# Patient Record
Sex: Female | Born: 1970 | Race: White | Hispanic: No | Marital: Married | Smoking: Never smoker
Health system: Southern US, Academic
[De-identification: ages and names within clinical notes are randomized; demographics above are authoritative.]

## PROBLEM LIST (undated history)

## (undated) DIAGNOSIS — E063 Autoimmune thyroiditis: Secondary | ICD-10-CM

## (undated) DIAGNOSIS — G4733 Obstructive sleep apnea (adult) (pediatric): Secondary | ICD-10-CM

## (undated) DIAGNOSIS — F32A Depression, unspecified: Secondary | ICD-10-CM

## (undated) DIAGNOSIS — E039 Hypothyroidism, unspecified: Secondary | ICD-10-CM

## (undated) DIAGNOSIS — K449 Diaphragmatic hernia without obstruction or gangrene: Secondary | ICD-10-CM

## (undated) DIAGNOSIS — K222 Esophageal obstruction: Secondary | ICD-10-CM

## (undated) DIAGNOSIS — F419 Anxiety disorder, unspecified: Secondary | ICD-10-CM

## (undated) DIAGNOSIS — K589 Irritable bowel syndrome without diarrhea: Secondary | ICD-10-CM

## (undated) DIAGNOSIS — G43909 Migraine, unspecified, not intractable, without status migrainosus: Secondary | ICD-10-CM

## (undated) DIAGNOSIS — I10 Essential (primary) hypertension: Secondary | ICD-10-CM

## (undated) DIAGNOSIS — Z15060 Genetic susceptibility to colorectal cancer: Secondary | ICD-10-CM

## (undated) DIAGNOSIS — E042 Nontoxic multinodular goiter: Secondary | ICD-10-CM

## (undated) DIAGNOSIS — K219 Gastro-esophageal reflux disease without esophagitis: Secondary | ICD-10-CM

## (undated) DIAGNOSIS — J309 Allergic rhinitis, unspecified: Secondary | ICD-10-CM

## (undated) DIAGNOSIS — L853 Xerosis cutis: Secondary | ICD-10-CM

## (undated) DIAGNOSIS — B009 Herpesviral infection, unspecified: Secondary | ICD-10-CM

## (undated) DIAGNOSIS — F909 Attention-deficit hyperactivity disorder, unspecified type: Secondary | ICD-10-CM

## (undated) HISTORY — DX: Attention-deficit hyperactivity disorder, unspecified type: F90.9

## (undated) HISTORY — DX: Herpesviral infection, unspecified: B00.9

## (undated) HISTORY — DX: Xerosis cutis: L85.3

## (undated) HISTORY — DX: Allergic rhinitis, unspecified: J30.9

## (undated) HISTORY — DX: Gastro-esophageal reflux disease without esophagitis: K21.9

## (undated) HISTORY — DX: Migraine, unspecified, not intractable, without status migrainosus: G43.909

## (undated) HISTORY — DX: Essential (primary) hypertension: I10

## (undated) HISTORY — DX: Diaphragmatic hernia without obstruction or gangrene: K44.9

## (undated) HISTORY — PX: HX BREAST AUGMENTATION: SHX7

## (undated) HISTORY — PX: HX HYSTERECTOMY: SHX81

## (undated) HISTORY — DX: Obstructive sleep apnea (adult) (pediatric): G47.33

## (undated) HISTORY — DX: Depression, unspecified: F32.A

## (undated) HISTORY — DX: Autoimmune thyroiditis: E06.3

## (undated) HISTORY — DX: Hypothyroidism, unspecified: E03.9

## (undated) HISTORY — DX: Esophageal obstruction: K22.2

## (undated) HISTORY — DX: Anxiety disorder, unspecified: F41.9

## (undated) HISTORY — DX: Genetic susceptibility to colorectal cancer: Z15.060

## (undated) HISTORY — DX: Irritable bowel syndrome, unspecified: K58.9

## (undated) HISTORY — DX: Nontoxic multinodular goiter: E04.2

---

## 2004-09-10 ENCOUNTER — Ambulatory Visit: Payer: Self-pay | Admitting: Family Medicine

## 2004-11-11 ENCOUNTER — Ambulatory Visit: Payer: Self-pay | Admitting: Family Medicine

## 2004-11-23 ENCOUNTER — Ambulatory Visit: Payer: Self-pay

## 2005-01-10 ENCOUNTER — Ambulatory Visit: Payer: Self-pay | Admitting: Family Medicine

## 2005-01-31 ENCOUNTER — Ambulatory Visit: Payer: Self-pay | Admitting: Internal Medicine

## 2005-07-11 ENCOUNTER — Ambulatory Visit: Payer: Self-pay | Admitting: Family Medicine

## 2005-08-10 ENCOUNTER — Ambulatory Visit: Payer: Self-pay | Admitting: Family Medicine

## 2005-08-19 ENCOUNTER — Ambulatory Visit: Payer: Self-pay | Admitting: Family Medicine

## 2005-09-14 ENCOUNTER — Ambulatory Visit: Payer: Self-pay | Admitting: Internal Medicine

## 2005-10-17 ENCOUNTER — Ambulatory Visit: Payer: Self-pay | Admitting: Family Medicine

## 2006-06-29 ENCOUNTER — Ambulatory Visit: Payer: Self-pay | Admitting: Family Medicine

## 2006-10-28 ENCOUNTER — Encounter: Payer: Self-pay | Admitting: Internal Medicine

## 2007-08-20 ENCOUNTER — Telehealth (INDEPENDENT_AMBULATORY_CARE_PROVIDER_SITE_OTHER): Payer: Self-pay | Admitting: *Deleted

## 2008-09-26 ENCOUNTER — Telehealth: Payer: Self-pay | Admitting: Family Medicine

## 2008-11-12 ENCOUNTER — Telehealth: Payer: Self-pay | Admitting: Family Medicine

## 2008-12-01 ENCOUNTER — Telehealth: Payer: Self-pay | Admitting: Family Medicine

## 2008-12-23 ENCOUNTER — Telehealth: Payer: Self-pay | Admitting: Family Medicine

## 2009-01-02 ENCOUNTER — Ambulatory Visit: Payer: Self-pay | Admitting: Family Medicine

## 2009-01-02 DIAGNOSIS — J309 Allergic rhinitis, unspecified: Secondary | ICD-10-CM | POA: Insufficient documentation

## 2009-03-02 ENCOUNTER — Ambulatory Visit: Payer: Self-pay | Admitting: Family Medicine

## 2009-03-02 DIAGNOSIS — Z9889 Other specified postprocedural states: Secondary | ICD-10-CM | POA: Insufficient documentation

## 2009-06-16 ENCOUNTER — Ambulatory Visit: Payer: Self-pay | Admitting: Family Medicine

## 2009-06-16 DIAGNOSIS — J019 Acute sinusitis, unspecified: Secondary | ICD-10-CM | POA: Insufficient documentation

## 2009-06-16 DIAGNOSIS — R5383 Other fatigue: Secondary | ICD-10-CM

## 2009-06-16 DIAGNOSIS — R5381 Other malaise: Secondary | ICD-10-CM | POA: Insufficient documentation

## 2009-06-16 DIAGNOSIS — K130 Diseases of lips: Secondary | ICD-10-CM | POA: Insufficient documentation

## 2009-09-24 ENCOUNTER — Telehealth: Payer: Self-pay | Admitting: Family Medicine

## 2009-10-08 ENCOUNTER — Telehealth: Payer: Self-pay | Admitting: Family Medicine

## 2009-10-22 ENCOUNTER — Encounter: Payer: Self-pay | Admitting: Family Medicine

## 2009-10-23 ENCOUNTER — Telehealth: Payer: Self-pay | Admitting: Family Medicine

## 2009-11-23 ENCOUNTER — Telehealth: Payer: Self-pay | Admitting: Family Medicine

## 2009-11-24 ENCOUNTER — Telehealth: Payer: Self-pay | Admitting: Family Medicine

## 2010-01-11 ENCOUNTER — Telehealth: Payer: Self-pay | Admitting: Family Medicine

## 2010-08-18 ENCOUNTER — Other Ambulatory Visit (INDEPENDENT_AMBULATORY_CARE_PROVIDER_SITE_OTHER): Payer: BC Managed Care – PPO

## 2010-08-18 DIAGNOSIS — E042 Nontoxic multinodular goiter: Secondary | ICD-10-CM

## 2010-10-10 LAB — CONVERTED CEMR LAB
CO2: 23 meq/L (ref 19–32)
Calcium: 9.6 mg/dL (ref 8.4–10.5)
Eosinophils Absolute: 0.2 10*3/uL (ref 0.0–0.7)
Eosinophils Relative: 4 % (ref 0–5)
HCT: 37.7 % (ref 36.0–46.0)
Lymphs Abs: 1.7 10*3/uL (ref 0.7–4.0)
MCV: 88.9 fL (ref 78.0–100.0)
Monocytes Relative: 8 % (ref 3–12)
Neutrophils Relative %: 52 % (ref 43–77)
RBC: 4.24 M/uL (ref 3.87–5.11)
Sodium: 142 meq/L (ref 135–145)
WBC: 5 10*3/uL (ref 4.0–10.5)
aPTT: 34 s (ref 24–37)

## 2010-10-12 NOTE — Progress Notes (Signed)
  Phone Note From Other Clinic   Summary of Call: call from oral sugeon Dr Cherly Anderson  he is seeing pt for persistant L jaw pain teeth are fine and no signs of TMJ is trying abx and interested in ordering a CT -- but primary care has to do that needs CT maxofacial with and without contrast to eval L mandible body for idopathic jaw pain  Follow-up for Phone Call        please call pt and see if she wants me to go ahead and order the test- thanks  Follow-up by: Judith Part MD,  October 23, 2009 1:43 PM  Additional Follow-up for Phone Call Additional follow up Details #1::        Left message on voicemail  to return call. Instructed pt. to leave her response on the VM if nurse not available.  Lugene Fuquay CMA (AAMA)  October 23, 2009 2:21 PM   Left message for patient to call back. Lewanda Rife LPN  October 26, 2009 10:56 AM     Additional Follow-up for Phone Call Additional follow up Details #2::    Patient will call next week to let us know if antibiotics work if not she will so the ct  ok- thanks for the update Follow-up by: Benny Lennert CMA Duncan Dull),  October 28, 2009 11:03 AM

## 2010-10-12 NOTE — Progress Notes (Signed)
Summary: call a nurse   Phone Note Call from Patient   Summary of Call: Triage Record Num: [id number] Operator: Albertine Grates Patient Name: Jocelyn Watson Call Date & Time: 01/10/2010 5:05:20PM Patient Phone: [phone] PCP: Audrie Gallus. Tower Patient Gender: Female PCP Fax : Patient DOB: June 01, 1971 Practice Name: [facility] Reason for Call: Thinks has UTI since 5-1. Has burning since 5-1. Has frequency. Afebrile. Is going out of town 5-2. Advised needs eval but wants MD notified as is going out of country. Dr. Clent Ridges notified and advised Macrobid 100mg  BID x 7 days and called to [phone]. Protocol(s) Used: Urinary Symptoms - Female Recommended Outcome per Protocol: See Provider within 24 hours Reason for Outcome: Urinary tract symptoms Care Advice:  ~ 05/ Initial call taken by: Melody Comas,  Jan 11, 2010 8:49 AM

## 2010-10-12 NOTE — Progress Notes (Signed)
Summary: RX Flonase  Phone Note Call from Patient   Caller: Patient Call For: Jocelyn Watson Summary of Call: Patient has a new insurance company and needs written rx for [facility] for a 90 day supply of her Flonase nasal spray. Patient also wants to know if you will give her a rx for prenatal vitamins to take. Please let patient know when rx's are ready for pickup, so that she can mail them off.  Follow-up for Phone Call        printed in put in nurse in box for pickup  Follow-up by: Jocelyn Watson,  September 25, 2009 7:59 AM  Additional Follow-up for Phone Call Additional follow up Details #1::        [facility] advising pt ok to pick up. Additional Follow-up by: Lowella Petties CMA,  September 25, 2009 8:17 AM    New/Updated Medications: PRENATAL VITAMINS 0.8 MG TABS (PRENATAL MULTIVIT-MIN-FE-FA) 1 by mouth once daily Prescriptions: PRENATAL VITAMINS 0.8 MG TABS (PRENATAL MULTIVIT-MIN-FE-FA) 1 by mouth once daily  #90 x 3   Entered and Authorized by:   Jocelyn Watson   Signed by:   Jocelyn Watson on 09/25/2009   Method used:   Print then Give to Patient   RxID:   [id number] FLONASE 50 MCG/ACT SUSP (FLUTICASONE PROPIONATE) 2 sprays in each nostril once daily  #3 mdi x 3   Entered and Authorized by:   Jocelyn Watson   Signed by:   Jocelyn Watson on 09/25/2009   Method used:   Print then Give to Patient   RxID:   [id number]

## 2010-10-12 NOTE — Progress Notes (Signed)
Summary: Clarification on prenatal vitamins  Phone Note Refill Request Call back at [phone] option [phone] Message from:  [facility] on October 08, 2009 4:50 PM  Refills Requested: Medication #1:  PRENATAL VITAMINS 0.8 MG TABS 1 by mouth once daily. Received call from [facility] needing clarification on Prenatal vitamins.  They have the Prenatal Plus vitamins but needs the ok from Dr. Milinda Antis before they fill the Rx.  Refer to order# [id number]   Method Requested: Telephone to Pharmacy Initial call taken by: Linde Gillis CMA Duncan Dull),  October 08, 2009 4:51 PM  Follow-up for Phone Call        please give them the ok to fill that- thanks Follow-up by: Judith Part MD,  October 08, 2009 8:44 PM  Additional Follow-up for Phone Call Additional follow up Details #1::        Advised pharmacist. Additional Follow-up by: Lowella Petties CMA,  October 09, 2009 9:56 AM

## 2010-10-12 NOTE — Progress Notes (Signed)
Summary: asking for xanax  Phone Note Call from Patient Call back at [phone]   Caller: Patient Call For: Judith Part MD Summary of Call: Pt had called yesterday asking for something to help her sleep, Remus Loffler was called in but she says that's not what she needs.  She says this is more of a stress issue than sleep issue.  She is asking that xanax be called to [facility].  She said she had this about 7 years ago when she went through a divorce. Initial call taken by: Lowella Petties CMA,  November 24, 2009 10:05 AM  Follow-up for Phone Call        that is fine - just use great caution of sedation and also habit forming potential  do not use on a regular basis  px written on EMR for call in  Follow-up by: Judith Part MD,  November 24, 2009 10:33 AM  Additional Follow-up for Phone Call Additional follow up Details #1::        Left message for patient to call back. Medication [facility]oned [facility] as instructed. Lewanda Rife LPN  November 24, 2009 11:31 AM   Advised pt. Additional Follow-up by: Lowella Petties CMA,  November 24, 2009 11:36 AM    New/Updated Medications: ALPRAZOLAM 0.5 MG TABS (ALPRAZOLAM) 1 by mouth once daily as needed anxiety Prescriptions: ALPRAZOLAM 0.5 MG TABS (ALPRAZOLAM) 1 by mouth once daily as needed anxiety  #20 x 0   Entered and Authorized by:   Judith Part MD   Signed by:   Lewanda Rife LPN on 33/29/5188   Method used:   Telephoned to ...       [facility] (retail)       [address]       [place], [place]  [address]       Ph: [phone]       Fax: [phone]   RxID:   [id number]

## 2010-10-12 NOTE — Letter (Signed)
Summary: [facility]   Imported By: Lanelle Bal 11/10/2009 09:27:23  _____________________________________________________________________  External Attachment:    Type:   Image     Comment:   External Document

## 2010-10-12 NOTE — Progress Notes (Signed)
Summary: ? Rx for Xanax  Phone Note Call from Patient Call back at Home Phone [phone]   Caller: Patient Call For: Jocelyn Watson Summary of Call: Patient would like a Rx called in for Xanax.  She is getting married next month and is having trouble sleeping.  [facility] Initial call taken by: Linde Gillis CMA Duncan Dull),  November 23, 2009 3:10 PM  Follow-up for Phone Call        congratulations on wedding  if this is purely a sleep issue - would rather use something less habit forming like Remus Loffler will put that on EMR for call in - use with caution as needed  try not to use every night  Follow-up by: Jocelyn Watson,  November 23, 2009 5:14 PM  Additional Follow-up for Phone Call Additional follow up Details #1::        Patient notified as instructed by telephone v/m due to lateness of day. [facility] as instructed. Lewanda Rife LPN  November 23, 2009 5:35 PM     New/Updated Medications: AMBIEN 10 MG TABS (ZOLPIDEM TARTRATE) 1/2 to 1 by mouth at bedtime as needed insomnia Prescriptions: AMBIEN 10 MG TABS (ZOLPIDEM TARTRATE) 1/2 to 1 by mouth at bedtime as needed insomnia  #30 x 0   Entered and Authorized by:   Jocelyn Watson   Signed by:   Lewanda Rife LPN on 09/81/1914   Method used:   [facility] ...       [facility]  [id number]       [address]       [place], [place]  [address]       Ph: [phone]       Fax: [phone]   RxID:   [id number]

## 2011-08-12 ENCOUNTER — Encounter (INDEPENDENT_AMBULATORY_CARE_PROVIDER_SITE_OTHER): Payer: Self-pay

## 2011-08-22 ENCOUNTER — Encounter (INDEPENDENT_AMBULATORY_CARE_PROVIDER_SITE_OTHER): Payer: Self-pay

## 2011-08-22 ENCOUNTER — Ambulatory Visit (INDEPENDENT_AMBULATORY_CARE_PROVIDER_SITE_OTHER): Payer: BC Managed Care – PPO

## 2011-08-22 VITALS — BP 110/84 | HR 72 | Resp 20 | Ht 63.0 in | Wt 152.8 lb

## 2011-08-22 DIAGNOSIS — E049 Nontoxic goiter, unspecified: Secondary | ICD-10-CM

## 2011-08-23 DIAGNOSIS — E049 Nontoxic goiter, unspecified: Secondary | ICD-10-CM | POA: Insufficient documentation

## 2011-08-23 NOTE — Progress Notes (Signed)
,  ULTRASOUND:  The patient underwent an ultrasound with the 40 mm 8 megahertz probe.  The following views were obtained; left short axis, right short axis, left long axis, and right long axis.      Right lobe is 0.95 cm x 1.25 cm x 1.30 cm V=0.81 cm3 Nodule is  0.50 cm x0.66 cm x 0.63 cm V= 0.12 cm3 In the longitudinal plane the nodule is 0.81 cm x 0.87 cm x 0.89 cm V=0.33cm3  Isthmus  0.26cm    Left Lobe is 1.07cm x 1.25 cm x 1.15 cm V=0.79cm3          Diagnosis: Non toxic Uni nodular goiter. No significant change since the previous study.      Recommendation: TSH, Repeat scan in one year.

## 2011-08-29 ENCOUNTER — Telehealth: Payer: Self-pay | Admitting: *Deleted

## 2011-08-29 NOTE — Telephone Encounter (Signed)
Pt states LMOM on Wednesday 12.12.12 concerning getting medical records for Insurance purposes [[facility]/fax: [phone]]

## 2011-08-29 NOTE — Telephone Encounter (Signed)
Spoke with pt and she is applying for insurance and wants last 5 years of notes for insurance purposes. Pt also request name change. I transferred pt to [facility].

## 2012-08-21 ENCOUNTER — Encounter (INDEPENDENT_AMBULATORY_CARE_PROVIDER_SITE_OTHER): Payer: Self-pay

## 2012-08-21 ENCOUNTER — Ambulatory Visit (INDEPENDENT_AMBULATORY_CARE_PROVIDER_SITE_OTHER): Payer: BC Managed Care – PPO

## 2012-08-21 VITALS — BP 128/92 | HR 94 | Resp 20 | Ht 63.0 in | Wt 159.0 lb

## 2012-08-21 DIAGNOSIS — E049 Nontoxic goiter, unspecified: Secondary | ICD-10-CM

## 2012-08-21 LAB — THYROXINE, FREE (FREE T4): THYROXINE, FREE (FREE T4): 0.9 ng/dL (ref 0.9–1.8)

## 2012-08-22 LAB — THYROID STIMULATING HORMONE (SENSITIVE TSH): TSH: 1.38 u[IU]/mL (ref 0.350–5.550)

## 2012-08-23 LAB — THYROPEROXIDASE (TPO) ANTIBODIES, SERUM: THYROID PEROXIDASE AB: 98 IU/mL — ABNORMAL HIGH (ref 0.0–35.0)

## 2012-08-23 NOTE — Progress Notes (Signed)
[facility], Department of Medicine and Specialty Office  [address] [place]  [place], [place] [address]  [phone]    PROGRESS NOTE    PATIENT NAME: Ana Perez, Ana Perez  CHART NUMBER: [id number]  DATE OF BIRTH: [birth year]  DATE OF SERVICE: 08/21/2012    SUBJECTIVE:  This is a 41 year old female with a history of a nontoxic nodular goiter.  She is having serial ultrasounds.      Ultrasound was performed with the linear ray probe.  Transverse cuts were obtained high, low and middle of the thyroid.  Longitudinal cuts were turned lateral, medial and middle.  Right lobe measures 1.02 x 0.908.  The isthmus is 0.019.  Left lobe is 1.23 x 1.36.  The tissue on both sides appears somewhat hypoechoic with a spongiform appearance.  This has not changed significantly since the previous study.    TSH is 1.380.  TPO antibodies are positive at 98.  FT4 is 0.9.    IMPRESSION:  Autoimmune thyroiditis.  The patient is euthyroid at this point.      PLAN:  She is to have another scan next year.      Katherene Ponto, MD  Professor, Department of Internal Medicine  [facility] [place] [facility]    HY/QM/[id number]; D: 08/23/2012 13:43:02; T: 08/23/2012 14:47:03

## 2013-08-21 ENCOUNTER — Ambulatory Visit (INDEPENDENT_AMBULATORY_CARE_PROVIDER_SITE_OTHER): Payer: BC Managed Care – PPO

## 2013-08-21 VITALS — BP 128/88 | HR 89 | Resp 20 | Ht 63.0 in | Wt 177.8 lb

## 2013-08-21 DIAGNOSIS — E063 Autoimmune thyroiditis: Secondary | ICD-10-CM

## 2013-08-21 LAB — THYROID STIMULATING HORMONE (SENSITIVE TSH): TSH: 1.52 u[IU]/mL (ref 0.350–5.550)

## 2013-08-21 LAB — THYROXINE, FREE (FREE T4): THYROXINE, FREE (FREE T4): 0.8 ng/dL — ABNORMAL LOW (ref 0.9–1.8)

## 2013-08-23 ENCOUNTER — Telehealth (INDEPENDENT_AMBULATORY_CARE_PROVIDER_SITE_OTHER): Payer: Self-pay

## 2013-08-23 DIAGNOSIS — E063 Autoimmune thyroiditis: Secondary | ICD-10-CM

## 2013-08-23 DIAGNOSIS — E049 Nontoxic goiter, unspecified: Secondary | ICD-10-CM

## 2013-08-23 MED ORDER — LEVOTHYROXINE 75 MCG TABLET
75.0000 ug | ORAL_TABLET | Freq: Every day | ORAL | Status: DC
Start: 2013-08-23 — End: 2013-12-31

## 2013-08-23 NOTE — Telephone Encounter (Signed)
Start thyroid hormone. Get lab in 6 weeks.

## 2013-08-23 NOTE — Telephone Encounter (Signed)
 Left message on patients cell phone [phone] that medication had been sent to her pharmacy and she needs to repeat her blood work in 6 weeks.

## 2013-08-23 NOTE — Telephone Encounter (Signed)
Patient calling back at your request to find out her lab results and if she needs to be on medication or not.

## 2013-08-26 NOTE — Progress Notes (Unsigned)
[facility], Department of Medicine and Specialty Office  [address] [place]  [place], [place] [address]  [phone]    PROGRESS NOTE    PATIENT NAME: Ana Perez, Ana Perez  CHART NUMBER: [id number]  DATE OF BIRTH: 17-Mar-1971  DATE OF SERVICE: 08/21/2013    SUBJECTIVE:  Ultrasound was performed using linear array probe.  Images were obtained in the upper, mid and lower portions transversally.  The right lobe measures 1.34 x 1.15 x 5.68 for a volume of 3.48 cm3.  Left lobe is 0.961 x 1.22 x 4.17 for a volume of 2.55 cm3.  The isthmus is 0.238 cm.  The scan shows typical scarring, Hashimoto's.  No specific nodules are noted.  No change has occurred since the previous study.    TSH was 1.520.  FT4 is 0.8.    PLAN:  In view of the fact the patient has positive TPO antibodies levothyroxine has been increased.  She is to repeat TSH in 6 weeks.  Images stored in [place].      Katherene Ponto, MD  Professor, Department of Internal Medicine  [facility] [place] [facility]    HY/QM/[id number]; D: 08/26/2013 13:04:50; T: 08/26/2013 16:19:23

## 2013-12-27 LAB — THYROXINE, FREE (FREE T4): THYROXINE, FREE (FREE T4): 0.5 ng/dL — ABNORMAL LOW (ref 0.9–1.8)

## 2013-12-30 ENCOUNTER — Telehealth (INDEPENDENT_AMBULATORY_CARE_PROVIDER_SITE_OTHER): Payer: Self-pay

## 2013-12-30 DIAGNOSIS — E063 Autoimmune thyroiditis: Secondary | ICD-10-CM

## 2013-12-30 NOTE — Telephone Encounter (Signed)
Patient is calling for her lab work results.

## 2013-12-31 MED ORDER — LEVOTHYROXINE 100 MCG TABLET
100.0000 ug | ORAL_TABLET | Freq: Every day | ORAL | Status: DC
Start: 2013-12-31 — End: 2024-02-14

## 2013-12-31 NOTE — Telephone Encounter (Signed)
THYROID HAS BEEN INCREASED. needs LABS IN  6 WEEKS

## 2013-12-31 NOTE — Telephone Encounter (Signed)
Left msg for pt to return my call

## 2014-02-14 LAB — THYROXINE, FREE (FREE T4): THYROXINE, FREE (FREE T4): 1.3 ng/dL (ref 0.9–1.8)

## 2014-02-14 LAB — THYROID STIMULATING HORMONE (SENSITIVE TSH): TSH: 0.354 u[IU]/mL (ref 0.350–5.550)

## 2014-08-21 ENCOUNTER — Encounter (INDEPENDENT_AMBULATORY_CARE_PROVIDER_SITE_OTHER): Payer: BC Managed Care – PPO

## 2015-10-23 ENCOUNTER — Encounter: Payer: Self-pay | Admitting: Primary Care

## 2015-10-23 ENCOUNTER — Telehealth: Payer: Self-pay | Admitting: Primary Care

## 2015-10-23 ENCOUNTER — Ambulatory Visit (INDEPENDENT_AMBULATORY_CARE_PROVIDER_SITE_OTHER): Payer: BLUE CROSS/BLUE SHIELD | Admitting: Primary Care

## 2015-10-23 ENCOUNTER — Encounter: Payer: Self-pay | Admitting: Radiology

## 2015-10-23 VITALS — BP 124/82 | HR 74 | Temp 97.7°F | Ht 69.0 in | Wt 162.8 lb

## 2015-10-23 DIAGNOSIS — F909 Attention-deficit hyperactivity disorder, unspecified type: Secondary | ICD-10-CM | POA: Insufficient documentation

## 2015-10-23 MED ORDER — AMPHETAMINE-DEXTROAMPHETAMINE 20 MG PO TABS: 20.0000 mg | ORAL_TABLET | Freq: Two times a day (BID) | ORAL | Status: DC

## 2015-10-23 NOTE — Progress Notes (Signed)
Pre visit review using our clinic review tool, if applicable. No additional management support is needed unless otherwise documented below in the visit note. 

## 2015-10-23 NOTE — Progress Notes (Signed)
   Subjective:    Patient ID: Jocelyn Watson, female    DOB: 12-Jun-1971, 45 y.o.   MRN: [id number]  HPI  Jocelyn Watson is a 45 year old female who presents today to establish care and discuss the problems mentioned below. Will obtain old records. Her last physical was in Spring of 2016.   1) ADHD: Endorses she was diagnosed in 2010 in [place] by previous PCP. She is currently managed on Adderall 20 mg BID and has been on the medication since 2010. She is currently out of her medication and is requesting a refill.  Her symptoms include difficulty completing tasks and concentrating. Denies chest pain, palpitations.   Review of Systems  Constitutional: Negative for unexpected weight change.  HENT: Negative for rhinorrhea.   Respiratory: Negative for cough and shortness of breath.   Cardiovascular: Negative for chest pain.  Gastrointestinal: Negative for diarrhea and constipation.  Genitourinary:       Regular periods  Musculoskeletal: Negative for myalgias and arthralgias.  Skin: Negative for rash.  Neurological: Negative for dizziness, numbness and headaches.  Psychiatric/Behavioral:       Denies concerns for anxiety or depression       Past Medical History  Diagnosis Date  . ADHD (attention deficit hyperactivity disorder)   . Allergic rhinitis     Social History   Social History  . Marital Status: Married    Spouse Name: N/A  . Number of Children: N/A  . Years of Education: N/A   Occupational History  . Not on file.   Social History Main Topics  . Smoking status: Never Smoker   . Smokeless tobacco: Not on file  . Alcohol Use: No  . Drug Use: No  . Sexual Activity: Not on file   Other Topics Concern  . Not on file   Social History Narrative   Married.   No Children.   Works in [employer].   Enjoys entertaining, working out.     No past surgical history on file.  Family History  Problem Relation Age of Onset  . Hypothyroidism Mother   . Heart disease  Father     Allergies  Allergen Reactions  . Codeine     REACTION: fever, vomiting    No current outpatient prescriptions on file prior to visit.   No current facility-administered medications on file prior to visit.    BP 124/82 mmHg  Pulse 74  Temp(Src) 97.7 F (36.5 C) (Oral)  Ht  (1.753 m)  Wt 162 lb 12.8 oz (73.846 kg)  BMI 24.03 kg/m2  SpO2 97%  LMP 10/10/2015    Objective:   Physical Exam  Constitutional: She is oriented to person, place, and time. She appears well-nourished.  Cardiovascular: Normal rate and regular rhythm.   Pulmonary/Chest: Effort normal and breath sounds normal.  Neurological: She is alert and oriented to person, place, and time.  Skin: Skin is warm and dry.  Psychiatric: She has a normal mood and affect.          Assessment & Plan:

## 2015-10-23 NOTE — Assessment & Plan Note (Signed)
Endorses history of since 2010, diagnosed by prior PCP in [place] and is out of medication. She is upset after I told her that I would need to gather records before I could prescribe any additional refills other than the one provided today.  We are working to get her records, only received 2 office visits from July and October 2017. She endorses care since 2010 from various locations of prior PCP office.  Will obtain records prior to any additional refills. UDS and controlled substance contract obtained.

## 2015-10-23 NOTE — Telephone Encounter (Signed)
Please notify Ms. Posas that I received records from 2015 and 2016 which is enough.   There is no evidence of formal ADHD testing in her records, but because she's been managed on this medication for years, I will continue to refill. If she notices that her medication isn't managing her symptoms as well, and requires an increase in her dose, she will need to go for formal testing.   As for now, I've printed off 2 additional scripts for her to use in March and April. Please have her call us when she's running low. These are ready for pick up at her convenience.   I would like to see her for follow up in 6 months, will you please schedule?  Thanks.

## 2015-10-23 NOTE — Patient Instructions (Signed)
We will work on gathering your records from the various locations of your prior PCP office. Please give me some time to complete this.  Stop by the lab for your urine drug screen and controlled substance contract.  It was a pleasure to meet you today! Please don't hesitate to call me with any questions. Welcome to [facility]!

## 2015-10-26 NOTE — Telephone Encounter (Signed)
Message left for patient to return my call.  

## 2015-10-26 NOTE — Telephone Encounter (Signed)
Called and notified patient of Kate's comments. Patient verbalized understanding. Rx are left in front office.

## 2015-11-11 ENCOUNTER — Ambulatory Visit: Payer: BLUE CROSS/BLUE SHIELD | Admitting: Primary Care

## 2015-11-12 ENCOUNTER — Encounter: Payer: Self-pay | Admitting: Primary Care

## 2015-11-13 ENCOUNTER — Ambulatory Visit: Payer: BLUE CROSS/BLUE SHIELD | Admitting: Primary Care

## 2016-01-05 ENCOUNTER — Other Ambulatory Visit: Payer: Self-pay | Admitting: Primary Care

## 2016-01-05 ENCOUNTER — Encounter: Payer: Self-pay | Admitting: Primary Care

## 2016-01-05 DIAGNOSIS — F909 Attention-deficit hyperactivity disorder, unspecified type: Secondary | ICD-10-CM

## 2016-01-05 MED ORDER — AMPHETAMINE-DEXTROAMPHETAMINE 20 MG PO TABS: 20.0000 mg | ORAL_TABLET | Freq: Two times a day (BID) | ORAL | Status: DC

## 2016-01-05 MED ORDER — AMPHETAMINE-DEXTROAMPHETAMINE 20 MG PO TABS
20.0000 mg | ORAL_TABLET | Freq: Every day | ORAL | Status: DC
Start: 2016-01-05 — End: 2016-04-14

## 2016-04-14 ENCOUNTER — Other Ambulatory Visit: Payer: Self-pay | Admitting: Primary Care

## 2016-04-14 DIAGNOSIS — F909 Attention-deficit hyperactivity disorder, unspecified type: Secondary | ICD-10-CM

## 2016-04-15 ENCOUNTER — Other Ambulatory Visit: Payer: Self-pay | Admitting: Primary Care

## 2016-04-15 DIAGNOSIS — F909 Attention-deficit hyperactivity disorder, unspecified type: Secondary | ICD-10-CM

## 2016-04-15 MED ORDER — AMPHETAMINE-DEXTROAMPHETAMINE 20 MG PO TABS: 20.0000 mg | ORAL_TABLET | Freq: Two times a day (BID) | ORAL | 0 refills | Status: DC

## 2016-07-11 ENCOUNTER — Other Ambulatory Visit: Payer: Self-pay | Admitting: Primary Care

## 2016-07-11 DIAGNOSIS — F909 Attention-deficit hyperactivity disorder, unspecified type: Secondary | ICD-10-CM

## 2016-07-11 NOTE — Telephone Encounter (Signed)
Last refill 04/15/16 #60 and 3 scripts given Last office visit 10/23/15

## 2016-07-12 ENCOUNTER — Other Ambulatory Visit: Payer: Self-pay | Admitting: Primary Care

## 2016-07-12 DIAGNOSIS — F909 Attention-deficit hyperactivity disorder, unspecified type: Secondary | ICD-10-CM

## 2016-07-12 MED ORDER — AMPHETAMINE-DEXTROAMPHETAMINE 20 MG PO TABS: 20.0000 mg | ORAL_TABLET | Freq: Two times a day (BID) | ORAL | 0 refills | Status: DC

## 2016-07-12 NOTE — Telephone Encounter (Signed)
Called patient and notified her that prescriptions are ready for pick up.  Also notified patient that she will need a follow up visit early February for any further refills.

## 2016-09-14 ENCOUNTER — Encounter: Payer: Self-pay | Admitting: Primary Care

## 2016-09-14 ENCOUNTER — Ambulatory Visit (INDEPENDENT_AMBULATORY_CARE_PROVIDER_SITE_OTHER): Payer: BLUE CROSS/BLUE SHIELD | Admitting: Primary Care

## 2016-09-14 ENCOUNTER — Other Ambulatory Visit: Payer: Self-pay | Admitting: Primary Care

## 2016-09-14 VITALS — BP 116/78 | HR 66 | Temp 97.4°F | Ht 69.0 in | Wt 165.0 lb

## 2016-09-14 DIAGNOSIS — Z79899 Other long term (current) drug therapy: Secondary | ICD-10-CM | POA: Diagnosis not present

## 2016-09-14 DIAGNOSIS — F909 Attention-deficit hyperactivity disorder, unspecified type: Secondary | ICD-10-CM | POA: Diagnosis not present

## 2016-09-14 DIAGNOSIS — H6122 Impacted cerumen, left ear: Secondary | ICD-10-CM | POA: Diagnosis not present

## 2016-09-14 MED ORDER — AMPHETAMINE-DEXTROAMPHETAMINE 20 MG PO TABS: 20.0000 mg | ORAL_TABLET | Freq: Two times a day (BID) | ORAL | 0 refills | Status: DC

## 2016-09-14 NOTE — Progress Notes (Signed)
Pre visit review using our clinic review tool, if applicable. No additional management support is needed unless otherwise documented below in the visit note. 

## 2016-09-14 NOTE — Patient Instructions (Addendum)
Please notify me if you find the prescription that was printed on 07/12/16 with a do not fill date until 09/10/16.  Your ECG looks good.  Stop by the lab to update your urine drug screen and controlled substance contract.  Follow up in 1 year for re-evaluation. It was a pleasure to see you today!

## 2016-09-14 NOTE — Progress Notes (Signed)
Subjective:    Patient ID: Jocelyn IvoryLisa Marie Watson, female    DOB: 10/12/1970, 46 y.o.   MRN: [id number]  HPI  Ms. Jocelyn Watson is a 46 year old female who presents today for follow up of ADHD. Diagnosed in 2010 by prior PCP. She established with us as a new patient nearly 1 year ago as she moved from [place]. She is currently managed on Adderall 20 mg BID and has been on this dose for years.   She denies chest pain, palpitations, dizziness, insomnia. She was prescribed three months supply of her Adderall on October 31st. Her last Rx is to be filled on 128/30/17. She states she only received two prescriptions at that time and is nearly out. She will check at home for her prescription and also contact the pharmacy.  2) Ear Pain: Located to her left ear that she first noticed several months ago. She's mostly noticed a decreased ability to hear. She has a history of cerumen impaction and has had to have cerumen removed with instrumentation as she cannot tolerate irrigation. She denies fevers, cough, sore throat, right ear symptoms.  Review of Systems  Constitutional: Negative for fever.  HENT: Positive for ear pain. Negative for congestion, sinus pressure and sore throat.   Respiratory: Negative for cough.   Cardiovascular: Negative for chest pain and palpitations.  Neurological: Negative for dizziness.       Past Medical History:  Diagnosis Date  . ADHD (attention deficit hyperactivity disorder)   . Allergic rhinitis      Social History   Social History  . Marital status: Married    Spouse name: N/A  . Number of children: N/A  . Years of education: N/A   Occupational History  . Not on file.   Social History Main Topics  . Smoking status: Never Smoker  . Smokeless tobacco: Not on file  . Alcohol use No  . Drug use: No  . Sexual activity: Not on file   Other Topics Concern  . Not on file   Social History Narrative   Married.   No Children.   Works in [employer].   Enjoys  entertaining, working out.     No past surgical history on file.  Family History  Problem Relation Age of Onset  . Hypothyroidism Mother   . Heart disease Father     Allergies  Allergen Reactions  . Codeine     REACTION: fever, vomiting    Current Outpatient Prescriptions on File Prior to Visit  Medication Sig Dispense Refill  . amphetamine-dextroamphetamine (ADDERALL) 20 MG tablet Take 1 tablet (20 mg total) by mouth 2 (two) times daily. 60 tablet 0  . amphetamine-dextroamphetamine (ADDERALL) 20 MG tablet Take 1 tablet (20 mg total) by mouth 2 (two) times daily. 60 tablet 0  . amphetamine-dextroamphetamine (ADDERALL) 20 MG tablet Take 1 tablet (20 mg total) by mouth 2 (two) times daily. 60 tablet 0   No current facility-administered medications on file prior to visit.     BP 116/78   Pulse 66   Temp 97.4 F (36.3 C) (Oral)   Ht 5\' 9"  (1.753 m)   Wt 165 lb (74.8 kg)   LMP 08/22/2016   SpO2 99%   BMI 24.37 kg/m    Objective:   Physical Exam  Constitutional: She appears well-nourished.  HENT:  Right Ear: Tympanic membrane and ear canal normal.  Left Ear: Tympanic membrane and ear canal normal.  Mild to moderate cerumen impaction to left canal.  Cerumen removed with instrumentation. TM's unremarkable post removal.  Neck: Neck supple.  Cardiovascular: Normal rate and regular rhythm.   Pulmonary/Chest: Effort normal and breath sounds normal.  Skin: Skin is warm and dry.  Psychiatric: She has a normal mood and affect.          Assessment & Plan:  Ear Pain:  Located to left ear, also with decreased hearing. History of cerumen impaction. Exam today with mild-moderate cerumen impaction to left canal. Cerumen removed with instrumentation today, she tolerated well. TM post removal unremarkable. Discussed use of Debrox drops.  Morrie Sheldon, NP

## 2016-09-14 NOTE — Telephone Encounter (Signed)
Last office visit 09/14/2016.  Last refilled 07/12/16 x 3 months of prescriptions.  Last one state do not fill until 09/10/16.  Refill?

## 2016-09-14 NOTE — Assessment & Plan Note (Signed)
Managed on Adderall 20 mg for years. Due for refill in February 2018. She will check her house and the pharmacy for her missing paper RX. Reviewed the [place] Controlled Substance data base, no suspicious activity. Update UDS and controlled substance contract today. ECG completed due to high risk medication use. NSR, no ST changes, no t-wave inversion. Will provide refills when due. Follow up in 1 year.

## 2016-09-15 ENCOUNTER — Other Ambulatory Visit: Payer: Self-pay | Admitting: Primary Care

## 2016-09-15 DIAGNOSIS — L853 Xerosis cutis: Secondary | ICD-10-CM

## 2016-09-15 MED ORDER — AMMONIUM LACTATE 12 % EX CREA: TOPICAL_CREAM | CUTANEOUS | 0 refills | Status: DC | PRN

## 2016-09-20 ENCOUNTER — Encounter: Payer: Self-pay | Admitting: Primary Care

## 2016-10-28 ENCOUNTER — Other Ambulatory Visit: Payer: Self-pay | Admitting: Primary Care

## 2016-10-28 ENCOUNTER — Encounter: Payer: Self-pay | Admitting: Primary Care

## 2016-10-28 DIAGNOSIS — J309 Allergic rhinitis, unspecified: Secondary | ICD-10-CM

## 2016-10-28 MED ORDER — FLUTICASONE PROPIONATE 50 MCG/ACT NA SUSP: 1.0000 | Freq: Two times a day (BID) | NASAL | 3 refills | Status: DC

## 2016-12-14 ENCOUNTER — Other Ambulatory Visit: Payer: Self-pay | Admitting: Primary Care

## 2016-12-14 DIAGNOSIS — F909 Attention-deficit hyperactivity disorder, unspecified type: Secondary | ICD-10-CM

## 2016-12-14 DIAGNOSIS — L853 Xerosis cutis: Secondary | ICD-10-CM

## 2016-12-16 MED ORDER — AMPHETAMINE-DEXTROAMPHETAMINE 20 MG PO TABS: 20.0000 mg | ORAL_TABLET | Freq: Two times a day (BID) | ORAL | 0 refills | Status: DC

## 2016-12-16 MED ORDER — AMMONIUM LACTATE 12 % EX CREA: TOPICAL_CREAM | CUTANEOUS | 0 refills | Status: DC | PRN

## 2016-12-16 NOTE — Telephone Encounter (Signed)
Notife patient thru MyChart that Rx are ready to be pick up . Left in the front office.

## 2016-12-16 NOTE — Telephone Encounter (Signed)
Please notify patient that Rx's are ready for pick up. Ensure UDS and contract UTD. 

## 2017-03-03 ENCOUNTER — Other Ambulatory Visit: Payer: Self-pay | Admitting: Primary Care

## 2017-03-03 DIAGNOSIS — J309 Allergic rhinitis, unspecified: Secondary | ICD-10-CM

## 2017-03-07 ENCOUNTER — Encounter: Payer: Self-pay | Admitting: Primary Care

## 2017-03-07 ENCOUNTER — Ambulatory Visit (INDEPENDENT_AMBULATORY_CARE_PROVIDER_SITE_OTHER): Payer: BLUE CROSS/BLUE SHIELD | Admitting: Primary Care

## 2017-03-07 ENCOUNTER — Ambulatory Visit (INDEPENDENT_AMBULATORY_CARE_PROVIDER_SITE_OTHER)
Admission: RE | Admit: 2017-03-07 | Discharge: 2017-03-07 | Disposition: A | Discharge status: Home / Self Care | Payer: BLUE CROSS/BLUE SHIELD | Source: Ambulatory Visit | Attending: Primary Care | Admitting: Primary Care

## 2017-03-07 VITALS — BP 120/78 | HR 76 | Temp 98.6°F | Ht 69.0 in | Wt 154.8 lb

## 2017-03-07 DIAGNOSIS — M5432 Sciatica, left side: Secondary | ICD-10-CM

## 2017-03-07 DIAGNOSIS — M5431 Sciatica, right side: Secondary | ICD-10-CM | POA: Insufficient documentation

## 2017-03-07 DIAGNOSIS — F909 Attention-deficit hyperactivity disorder, unspecified type: Secondary | ICD-10-CM

## 2017-03-07 MED ORDER — AMPHETAMINE-DEXTROAMPHETAMINE 20 MG PO TABS
20.0000 mg | ORAL_TABLET | Freq: Two times a day (BID) | ORAL | 0 refills | Status: DC
Start: 2017-03-07 — End: 2017-05-22

## 2017-03-07 MED ORDER — GABAPENTIN 100 MG PO CAPS: 100.0000 mg | ORAL_CAPSULE | Freq: Two times a day (BID) | ORAL | 0 refills | Status: DC | PRN

## 2017-03-07 MED ORDER — AMPHETAMINE-DEXTROAMPHETAMINE 20 MG PO TABS: 20.0000 mg | ORAL_TABLET | Freq: Two times a day (BID) | ORAL | 0 refills | Status: DC

## 2017-03-07 NOTE — Patient Instructions (Signed)
Complete xray(s) prior to leaving today. I will notify you of your results once received.  You may try Gabapentin 100 mg capsules. Start by taking 1-2 capsules by mouth twice daily as needed for sciatic pain. Caution as this may cause drowsiness. We can always titrate the medication to a higher dose if needed.  Continue to work on core strength and regular exercise.  It was a pleasure to see you today!

## 2017-03-07 NOTE — Progress Notes (Signed)
Subjective:    Patient ID: Jocelyn Watson, female    DOB: 1970/10/06, 46 y.o.   MRN: [id number]  HPI  Jocelyn Watson is a 46 year old female who presents today for medication refill of Adderall and a chief complaint of chronic sciatic pain.  Doing well on her current dose of Adderall 20 mg BID. She denies palpitations, chest pain, dizziness.   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 This has been chronic for years, worse over the last 6 months. Her symptoms will wax and wane with 3 days of discomfort with several days of relief. Her pain will radiate down through her calves at times, mostly to her buttocks. She's not had recent imaging of her lower back or hips.   She denies back pain, hip pain, numbness/tingling, weakness, injury/trauma. She's been using massage pads, sitting on doughnut pillows, and taking max doses of Ibuprofen, and stretching with a friend who does physical therapy with little/temporary improvement.    Review of Systems  Cardiovascular: Negative for chest pain and palpitations.  Musculoskeletal: Negative for back pain.  Neurological: Negative for weakness and numbness.       Intermittent sciatic nerve pain bilaterally, recently to left buttocks       Past Medical History:  Diagnosis Date  . ADHD (attention deficit hyperactivity disorder)   . Allergic rhinitis      Social History   Social History  . Marital status: Married    Spouse name: N/A  . Number of children: N/A  . Years of education: N/A   Occupational History  . Not on file.   Social History Main Topics  . Smoking status: Never Smoker  . Smokeless tobacco: Never Used  . Alcohol use No  . Drug use: No  . Sexual activity: Not on file   Other Topics Concern  . Not on file   Social History Narrative   Married.   No Children.   Works in [employer].   Enjoys  entertaining, working out.     No past surgical history on file.  Family History  Problem Relation Age of Onset  . Hypothyroidism Mother   . Heart disease Father     Allergies  Allergen Reactions  . Codeine     REACTION: fever, vomiting    Current Outpatient Prescriptions on File Prior to Visit  Medication Sig Dispense Refill  . ammonium lactate (AMLACTIN) 12 % cream Apply topically as needed for dry skin. 385 g 0  . fluticasone (FLONASE) 50 MCG/ACT nasal spray SPRAY 1 SPRAY EACH NOSTRIL TWICE A DAY 16 g 1   No current facility-administered medications on file prior to visit.     BP 120/78   Pulse 76   Temp 98.6 F (37 C) (Oral)   Ht 5\' 9"  (1.753 m)   Wt 154 lb 12.8 oz (70.2 kg)   LMP 03/05/2017   SpO2 99%   BMI 22.86 kg/m    Objective:   Physical Exam  Constitutional: She appears well-nourished.  Cardiovascular: Normal rate and regular rhythm.   Pulmonary/Chest: Effort normal and breath sounds normal.  Musculoskeletal: Normal range of motion.       Lumbar back: She exhibits normal range of motion, no tenderness, no bony tenderness and no pain.  Strength to lower extremities equal at 5/5 today. Negative straight leg raise bilaterally.   Skin: Skin is warm and  dry.          Assessment & Plan:

## 2017-03-07 NOTE — Assessment & Plan Note (Signed)
Chronic for years, worse over last 6 months, little improvement with conservative treatment. Plain films of the lumbar spine pending today. Rx for low dose Gabapentin provided today. Consider MRI in the future if symptoms persist. No alarm signs today.

## 2017-03-07 NOTE — Assessment & Plan Note (Signed)
Doing well on current dose of Adderall. Continue same, refills provided.

## 2017-03-27 ENCOUNTER — Other Ambulatory Visit: Payer: Self-pay | Admitting: Primary Care

## 2017-03-27 DIAGNOSIS — M5432 Sciatica, left side: Principal | ICD-10-CM

## 2017-03-27 DIAGNOSIS — M5431 Sciatica, right side: Secondary | ICD-10-CM

## 2017-03-27 NOTE — Telephone Encounter (Signed)
Message left for patient to return my call.  

## 2017-03-27 NOTE — Telephone Encounter (Signed)
Please check on patient, how's her back pain since starting gabapentin? How often is she taking this medication? Is she really out? Please answer all questions.

## 2017-03-27 NOTE — Telephone Encounter (Signed)
Ok to refill? Electronically refill request for gabapentin (NEURONTIN) 100 MG capsule  Last prescribed and seen on 03/07/2017.  

## 2017-03-28 NOTE — Telephone Encounter (Signed)
Spoken and notified patient of Kate's comments. Patient stated that it was a mistake, she did not mean to refill for the gabapentin. She said that this medication is helping her a lot with her back pain. She had plenty of capsules left, she is taking on most day just 1 capsule 2 times daily.

## 2017-03-28 NOTE — Telephone Encounter (Signed)
Noted, glad to hear. 

## 2017-03-28 NOTE — Telephone Encounter (Signed)
Per DPR, left detail message of Kate's comments for patient to call back. 

## 2017-03-28 NOTE — Telephone Encounter (Signed)
Pt returned your call  Best number [phone]

## 2017-04-21 ENCOUNTER — Other Ambulatory Visit: Payer: Self-pay | Admitting: Primary Care

## 2017-04-21 DIAGNOSIS — J309 Allergic rhinitis, unspecified: Secondary | ICD-10-CM

## 2017-05-08 ENCOUNTER — Other Ambulatory Visit: Payer: Self-pay | Admitting: Primary Care

## 2017-05-08 ENCOUNTER — Encounter: Payer: Self-pay | Admitting: Primary Care

## 2017-05-08 DIAGNOSIS — M5432 Sciatica, left side: Principal | ICD-10-CM

## 2017-05-08 DIAGNOSIS — M5431 Sciatica, right side: Secondary | ICD-10-CM

## 2017-05-08 NOTE — Telephone Encounter (Signed)
Ok to refill? Electronically refill request for gabapentin (NEURONTIN) 100 MG capsule  Last prescribed and seen on 03/07/2017.

## 2017-05-08 NOTE — Telephone Encounter (Signed)
Left detailed message on voicemail asking for return call with info.

## 2017-05-08 NOTE — Telephone Encounter (Signed)
Does she need a refill? If so how many capsules that she taking, 1 or 2? How many times a day she taking the medication?

## 2017-05-09 MED ORDER — GABAPENTIN 100 MG PO CAPS: 100.0000 mg | ORAL_CAPSULE | Freq: Two times a day (BID) | ORAL | 0 refills | Status: DC | PRN

## 2017-05-09 NOTE — Telephone Encounter (Signed)
Noted, refill sent to pharmacy. 

## 2017-05-09 NOTE — Telephone Encounter (Signed)
Patient reply through MyChart as stated  Hi, I just received a voicemail regarding my request for gabapentin. I requested it online so [facility]. I am all out. I am taking one in the morning and sometimes another one in the evening please I am leaving out of town and would really like to get this filled before I am sitting in the car for 11 hours. Thank you so much.

## 2017-05-22 ENCOUNTER — Other Ambulatory Visit: Payer: Self-pay | Admitting: Primary Care

## 2017-05-22 DIAGNOSIS — F909 Attention-deficit hyperactivity disorder, unspecified type: Secondary | ICD-10-CM

## 2017-05-22 MED ORDER — AMPHETAMINE-DEXTROAMPHETAMINE 20 MG PO TABS: 20.0000 mg | ORAL_TABLET | Freq: Two times a day (BID) | ORAL | 0 refills | Status: DC

## 2017-05-22 NOTE — Telephone Encounter (Signed)
Ok to refill? Electronically refill request for amphetamine-dextroamphetamine (ADDERALL) 20 MG tablet  Last prescribed and seen on 03/07/2017. USD is updated.

## 2017-07-10 ENCOUNTER — Ambulatory Visit (INDEPENDENT_AMBULATORY_CARE_PROVIDER_SITE_OTHER): Payer: BLUE CROSS/BLUE SHIELD | Admitting: Primary Care

## 2017-07-10 ENCOUNTER — Encounter: Payer: Self-pay | Admitting: Primary Care

## 2017-07-10 VITALS — BP 122/80 | HR 77 | Temp 98.2°F | Ht 69.0 in | Wt 158.8 lb

## 2017-07-10 DIAGNOSIS — Z79899 Other long term (current) drug therapy: Secondary | ICD-10-CM

## 2017-07-10 DIAGNOSIS — J069 Acute upper respiratory infection, unspecified: Secondary | ICD-10-CM | POA: Diagnosis not present

## 2017-07-10 MED ORDER — AZITHROMYCIN 250 MG PO TABS: ORAL_TABLET | ORAL | 0 refills | Status: DC

## 2017-07-10 MED ORDER — BENZONATATE 200 MG PO CAPS: 200.0000 mg | ORAL_CAPSULE | Freq: Three times a day (TID) | ORAL | 0 refills | Status: DC | PRN

## 2017-07-10 NOTE — Patient Instructions (Signed)
Start Azithromycin antibiotics. Take 2 tablets by mouth today, then 1 tablet daily for 4 additional days.  You may take Benzonatate capsules for cough. Take 1 capsule by mouth three times daily as needed for cough.  Ensure you are staying hydrated with water and rest.  It was a pleasure to see you today!

## 2017-07-10 NOTE — Progress Notes (Signed)
Subjective:    Patient ID: Jocelyn Watson, female    DOB: 23-Sep-1970, 46 y.o.   MRN: [id number]  HPI  Jocelyn Watson is a 46 year old female who presents today with a chief complaint of cough. She also reports nasal congestion, fatigue, sore throat. She's been taking Nyquil, cough drops, doing Neti Pot rinses without much improvement. Her cough is productive with green sputum. She's not checking her temperature. Overall she's not noticed any improvement. Her symptoms began 10 days ago.  Review of Systems  Constitutional: Positive for fatigue. Negative for chills.  HENT: Positive for congestion, sinus pressure and sore throat. Negative for ear pain and rhinorrhea.   Respiratory: Positive for cough.   Cardiovascular: Negative for chest pain.       Past Medical History:  Diagnosis Date  . ADHD (attention deficit hyperactivity disorder)   . Allergic rhinitis      Social History   Social History  . Marital status: Married    Spouse name: N/A  . Number of children: N/A  . Years of education: N/A   Occupational History  . Not on file.   Social History Main Topics  . Smoking status: Never Smoker  . Smokeless tobacco: Never Used  . Alcohol use No  . Drug use: No  . Sexual activity: Not on file   Other Topics Concern  . Not on file   Social History Narrative   Married.   No Children.   Works in [employer].   Enjoys entertaining, working out.     No past surgical history on file.  Family History  Problem Relation Age of Onset  . Hypothyroidism Mother   . Heart disease Father     Allergies  Allergen Reactions  . Codeine     REACTION: fever, vomiting    Current Outpatient Prescriptions on File Prior to Visit  Medication Sig Dispense Refill  . ammonium lactate (AMLACTIN) 12 % cream Apply topically as needed for dry skin. 385 g 0  . amphetamine-dextroamphetamine (ADDERALL) 20 MG tablet Take 1 tablet (20 mg total) by mouth 2 (two) times daily. 60 tablet 0  .  amphetamine-dextroamphetamine (ADDERALL) 20 MG tablet Take 1 tablet (20 mg total) by mouth 2 (two) times daily. 60 tablet 0  . amphetamine-dextroamphetamine (ADDERALL) 20 MG tablet Take 1 tablet (20 mg total) by mouth 2 (two) times daily. 60 tablet 0  . fluticasone (FLONASE) 50 MCG/ACT nasal spray SPRAY 1 SPRAY EACH NOSTRIL TWICE A DAY 16 g 3  . gabapentin (NEURONTIN) 100 MG capsule Take 1 capsule (100 mg total) by mouth 2 (two) times daily as needed (sciatic pain). 90 capsule 0   No current facility-administered medications on file prior to visit.     BP 122/80   Pulse 77   Temp 98.2 F (36.8 C) (Oral)   Ht 5\' 9"  (1.753 m)   Wt 158 lb 12.8 oz (72 kg)   LMP 06/20/2017   SpO2 99%   BMI 23.45 kg/m    Objective:   Physical Exam  Constitutional: She appears well-nourished. She appears ill.  HENT:  Right Ear: Tympanic membrane and ear canal normal.  Left Ear: Tympanic membrane and ear canal normal.  Nose: Mucosal edema present. Right sinus exhibits no maxillary sinus tenderness and no frontal sinus tenderness. Left sinus exhibits no maxillary sinus tenderness and no frontal sinus tenderness.  Mouth/Throat: Oropharynx is clear and moist.  Eyes: Conjunctivae are normal.  Neck: Neck supple.  Cardiovascular: Normal rate  and regular rhythm.   Pulmonary/Chest: Effort normal and breath sounds normal. She has no wheezes. She has no rales.  Lymphadenopathy:    She has no cervical adenopathy.  Skin: Skin is warm and dry.          Assessment & Plan:  URI:  Cough, congestion, fatigue x 10 days. Symptoms and exam suspicious for bacterial involvement. Rx for Zpak and benzonatate capsules sent to pharmacy. Fluids, rest, follow up PRN.  Morrie Sheldonlark,Savior Himebaugh Kendal, NP

## 2017-07-14 LAB — PAIN MGMT, PROFILE 8 W/CONF, U
6 ACETYLMORPHINE: NEGATIVE ng/mL (ref <10)
ALCOHOL METABOLITES: POSITIVE ng/mL — AB (ref <500)
AMPHETAMINES: NEGATIVE ng/mL (ref <500)
Benzodiazepines: NEGATIVE ng/mL (ref <100)
Buprenorphine, Urine: NEGATIVE ng/mL (ref <5)
COCAINE METABOLITE: NEGATIVE ng/mL (ref <150)
CREATININE: 13.4 mg/dL — AB
Ethyl Glucuronide (ETG): NEGATIVE ng/mL (ref <500)
Ethyl Sulfate (ETS): 183 ng/mL — ABNORMAL HIGH (ref <100)
MDMA: NEGATIVE ng/mL (ref <500)
Marijuana Metabolite: NEGATIVE ng/mL (ref <20)
OXIDANT: NEGATIVE ug/mL (ref <200)
OXYCODONE: NEGATIVE ng/mL (ref <100)
Opiates: NEGATIVE ng/mL (ref <100)
PH: 7.65 (ref 4.5–9.0)
SPECIFIC GRAVITY: 1.006 (ref >=1.0)

## 2017-08-07 ENCOUNTER — Other Ambulatory Visit: Payer: Self-pay | Admitting: Primary Care

## 2017-08-07 DIAGNOSIS — J309 Allergic rhinitis, unspecified: Secondary | ICD-10-CM

## 2017-09-15 ENCOUNTER — Other Ambulatory Visit: Payer: Self-pay | Admitting: Primary Care

## 2017-09-15 DIAGNOSIS — L853 Xerosis cutis: Secondary | ICD-10-CM

## 2017-09-15 DIAGNOSIS — F909 Attention-deficit hyperactivity disorder, unspecified type: Secondary | ICD-10-CM

## 2017-09-15 MED ORDER — AMPHETAMINE-DEXTROAMPHETAMINE 20 MG PO TABS: 20.0000 mg | ORAL_TABLET | Freq: Two times a day (BID) | ORAL | 0 refills | Status: DC

## 2017-09-15 MED ORDER — AMMONIUM LACTATE 12 % EX CREA: TOPICAL_CREAM | CUTANEOUS | 0 refills | Status: DC | PRN

## 2017-09-15 NOTE — Telephone Encounter (Signed)
Ok to refill? Electronically refill request for   ammonium lactate (AMLACTIN) 12 % cream - last prescribed on 12/16/2016  amphetamine-dextroamphetamine (ADDERALL) 20 MG tablet - last prescribed on 05/22/2017.  Last seen on 07/10/2017

## 2017-09-15 NOTE — Telephone Encounter (Signed)
Due for follow up visit in June 2019, please remind patient. PMP aware site reviewed, no suspicious activity. Prescriptions printed and ready for pickup at her convenience.

## 2017-09-18 NOTE — Telephone Encounter (Signed)
Spoken and notified patient of Kate's comments. Patient verbalized understanding.  Left Rx in the front office.

## 2017-10-10 ENCOUNTER — Telehealth: Payer: Self-pay | Admitting: Primary Care

## 2017-10-10 NOTE — Telephone Encounter (Signed)
Noted and completed. Placed in Chan's inbox.

## 2017-10-10 NOTE — Telephone Encounter (Signed)
Pt dropped off respiratory medical questionnaire form for kate to sign  In rx tower up front

## 2017-10-10 NOTE — Telephone Encounter (Signed)
Place form in Kate's inbox for review. 

## 2017-10-23 ENCOUNTER — Encounter: Payer: Self-pay | Admitting: Primary Care

## 2017-10-23 ENCOUNTER — Ambulatory Visit: Payer: BLUE CROSS/BLUE SHIELD | Admitting: Primary Care

## 2017-10-23 VITALS — BP 120/76 | HR 63 | Temp 98.0°F | Ht 69.0 in | Wt 162.4 lb

## 2017-10-23 DIAGNOSIS — R635 Abnormal weight gain: Secondary | ICD-10-CM | POA: Diagnosis not present

## 2017-10-23 DIAGNOSIS — H539 Unspecified visual disturbance: Secondary | ICD-10-CM

## 2017-10-23 DIAGNOSIS — R432 Parageusia: Secondary | ICD-10-CM | POA: Diagnosis not present

## 2017-10-23 NOTE — Patient Instructions (Signed)
Stop by the lab prior to leaving today. I will notify you of your results once received.   It was a pleasure to see you today!  

## 2017-10-23 NOTE — Progress Notes (Signed)
Subjective:    Patient ID: Jocelyn Watson, female    DOB: [birth year], 47 y.o.   MRN: [id number]  HPI  Jocelyn Watson is a 47 year old female with a history of ADHD and lower extremity sciatica who presents today with a chief complaint of weight gain.  She also reports a sweet taste in her mouth, blurry vision. Symptoms are intermittent for which have been present for the past 6 weeks. She underwent an eye exam two months ago and was provided with new glasses and contacts. She denies changes in diet or exercise routine, numbness/tingling, cold/heat intolerance.  She does have a family history of hypothyroidism in her mother.  She was managed on Adipex in early to mid 2018, has not taken this within the last several months.  Wt Readings from Last 3 Encounters:  10/23/17 162 lb 6.4 oz (73.7 kg)  07/10/17 158 lb 12.8 oz (72 kg)  03/07/17 154 lb 12.8 oz (70.2 kg)   Diet currently consists of:  Breakfast: Bread, eggs Lunch: [place] wrap Dinner: Vegetable, lean protein Snacks: String cheese, vegetables Desserts: None Beverages: Water (close to one gallon daily).  Exercise: Running 6-7 miles five days weekly.   Review of Systems  Constitutional: Negative for fatigue.  Eyes: Positive for visual disturbance.  Respiratory: Negative for shortness of breath.   Cardiovascular: Negative for chest pain and palpitations.  Endocrine: Negative for cold intolerance and heat intolerance.  Neurological: Negative for dizziness and headaches.       Past Medical History:  Diagnosis Date  . ADHD (attention deficit hyperactivity disorder)   . Allergic rhinitis      Social History   Socioeconomic History  . Marital status: Married    Spouse name: Not on file  . Number of children: Not on file  . Years of education: Not on file  . Highest education level: Not on file  Social Needs  . Financial resource strain: Not on file  . Food insecurity - worry: Not on file  . Food insecurity -  inability: Not on file  . Transportation needs - medical: Not on file  . Transportation needs - non-medical: Not on file  Occupational History  . Not on file  Tobacco Use  . Smoking status: Never Smoker  . Smokeless tobacco: Never Used  Substance and Sexual Activity  . Alcohol use: No    Alcohol/week: 0.0 oz  . Drug use: No  . Sexual activity: Not on file  Other Topics Concern  . Not on file  Social History Narrative   Married.   No Children.   Works in [employer].   Enjoys entertaining, working out.       Family History  Problem Relation Age of Onset  . Hypothyroidism Mother   . Heart disease Father   . Diabetes Maternal Aunt     Allergies  Allergen Reactions  . Codeine     REACTION: fever, vomiting    Current Outpatient Medications on File Prior to Visit  Medication Sig Dispense Refill  . ammonium lactate (AMLACTIN) 12 % cream Apply topically as needed for dry skin. 385 g 0  . amphetamine-dextroamphetamine (ADDERALL) 20 MG tablet Take 1 tablet (20 mg total) by mouth 2 (two) times daily. 60 tablet 0  . amphetamine-dextroamphetamine (ADDERALL) 20 MG tablet Take 1 tablet (20 mg total) by mouth 2 (two) times daily. 60 tablet 0  . amphetamine-dextroamphetamine (ADDERALL) 20 MG tablet Take 1 tablet (20 mg total) by mouth 2 (two) times  daily. 60 tablet 0  . fluticasone (FLONASE) 50 MCG/ACT nasal spray SPRAY 1 SPRAY EACH NOSTRIL TWICE A DAY 16 g 1  . gabapentin (NEURONTIN) 100 MG capsule Take 1 capsule (100 mg total) by mouth 2 (two) times daily as needed (sciatic pain). 90 capsule 0   No current facility-administered medications on file prior to visit.     BP 120/76   Pulse 63   Temp 98 F (36.7 C) (Oral)   Ht 5\' 9"  (1.753 m)   Wt 162 lb 6.4 oz (73.7 kg)   LMP 10/08/2017   SpO2 98%   BMI 23.98 kg/m    Objective:   Physical Exam  Constitutional: She appears well-nourished.  Neck: Neck supple. No thyromegaly present.  Cardiovascular: Normal rate and regular  rhythm.  Pulmonary/Chest: Effort normal and breath sounds normal.  Skin: Skin is warm and dry.          Assessment & Plan:  Visual Disturbance, Weight Gain, Altered Taste:  Symptoms present intermittently for the past 6 weeks. Doubt diabetes diagnosis, but A1c pending. Check TSH today, especially given family history of hypothyroidism. Discussed that she is not overweight in fact her BMI is within normal limits. Exam today unremarkable. Consider sending to optometrist if labs unremarkable.  Doreene NestKatherine K Marlaina Coburn, NP

## 2017-10-24 LAB — COMPREHENSIVE METABOLIC PANEL
ALBUMIN: 4.4 g/dL (ref 3.5–5.2)
ALT: 21 U/L (ref 0–35)
AST: 23 U/L (ref 0–37)
Alkaline Phosphatase: 54 U/L (ref 39–117)
BILIRUBIN TOTAL: 0.4 mg/dL (ref 0.2–1.2)
BUN: 21 mg/dL (ref 6–23)
CHLORIDE: 107 meq/L (ref 96–112)
CO2: 26 mEq/L (ref 19–32)
CREATININE: 0.91 mg/dL (ref 0.40–1.20)
Calcium: 9.4 mg/dL (ref 8.4–10.5)
GFR: 71.2 mL/min (ref >60.00)
Glucose, Bld: 87 mg/dL (ref 70–99)
Potassium: 3.9 mEq/L (ref 3.5–5.1)
SODIUM: 141 meq/L (ref 135–145)
TOTAL PROTEIN: 6.8 g/dL (ref 6.0–8.3)

## 2017-10-24 LAB — TSH: TSH: 1.83 u[IU]/mL (ref 0.35–4.50)

## 2017-10-24 LAB — HEMOGLOBIN A1C: HEMOGLOBIN A1C: 5.2 % (ref 4.6–6.5)

## 2017-11-07 ENCOUNTER — Other Ambulatory Visit: Payer: Self-pay | Admitting: Primary Care

## 2017-11-07 DIAGNOSIS — J309 Allergic rhinitis, unspecified: Secondary | ICD-10-CM

## 2017-12-28 ENCOUNTER — Other Ambulatory Visit: Payer: Self-pay | Admitting: Primary Care

## 2017-12-28 DIAGNOSIS — F909 Attention-deficit hyperactivity disorder, unspecified type: Secondary | ICD-10-CM

## 2017-12-28 NOTE — Telephone Encounter (Signed)
Ok to refill? Electronically refill request for amphetamine-dextroamphetamine (ADDERALL) 20 MG tablet  Last prescribed on 09/15/2017. Last seen on 10/23/2017

## 2017-12-29 MED ORDER — AMPHETAMINE-DEXTROAMPHETAMINE 20 MG PO TABS: 20.0000 mg | ORAL_TABLET | Freq: Two times a day (BID) | ORAL | 0 refills | Status: DC

## 2017-12-29 NOTE — Telephone Encounter (Signed)
Please notify patient that I see that she's back on Phentermine per her GYN (saw this on PMP Aware). Needs to be aware that by taking both Adderall and Phentermine she's at risk for tachycardia and high blood pressure.   Refill sent to pharmacy. PMP aware reviewed and no suspicious activity. UDS is up to date.

## 2018-01-05 NOTE — Telephone Encounter (Signed)
Spoken and notified patient of Kate Clark's comments. Patient verbalized understanding.  

## 2018-01-09 LAB — HM MAMMOGRAPHY

## 2018-01-29 ENCOUNTER — Other Ambulatory Visit: Payer: Self-pay | Admitting: Primary Care

## 2018-01-29 DIAGNOSIS — F909 Attention-deficit hyperactivity disorder, unspecified type: Secondary | ICD-10-CM

## 2018-01-30 MED ORDER — AMPHETAMINE-DEXTROAMPHETAMINE 20 MG PO TABS: 20.0000 mg | ORAL_TABLET | Freq: Two times a day (BID) | ORAL | 0 refills | Status: DC

## 2018-01-30 NOTE — Telephone Encounter (Signed)
No suspicious activity on PMP Aware, UDS is up to date. Refill sent to pharmacy.

## 2018-01-30 NOTE — Telephone Encounter (Signed)
Ok to refill? MyChart refill request for amphetamine-dextroamphetamine (ADDERALL) 20 MG tablet  Last prescribed on 12/29/2017. Last seen on 10/23/2017

## 2018-01-31 ENCOUNTER — Other Ambulatory Visit: Payer: Self-pay | Admitting: Primary Care

## 2018-01-31 DIAGNOSIS — J309 Allergic rhinitis, unspecified: Secondary | ICD-10-CM

## 2018-03-01 ENCOUNTER — Other Ambulatory Visit: Payer: Self-pay | Admitting: Primary Care

## 2018-03-01 DIAGNOSIS — F909 Attention-deficit hyperactivity disorder, unspecified type: Secondary | ICD-10-CM

## 2018-03-01 MED ORDER — AMPHETAMINE-DEXTROAMPHETAMINE 20 MG PO TABS: 20.0000 mg | ORAL_TABLET | Freq: Two times a day (BID) | ORAL | 0 refills | Status: DC

## 2018-03-01 NOTE — Telephone Encounter (Signed)
No suspicious activity on PMP aware. Is still filling Phentermine that is provided by another provider. Have discussed the dangers of taking both medications and advised against Phentermine. Refill sent to pharmacy.

## 2018-03-01 NOTE — Telephone Encounter (Signed)
Name of Medication: amphetamine-dextroamphetamine (ADDERALL) 20 MG tablet  [facility] [address]   Last Lenox AhrFill or Written Date and Quantity: 01/30/2018 #60  Last Office Visit and Type: 10/23/2017 Acute Visit  Next Office Visit and Type:   Last Controlled Substance Agreement Date: 07/10/2017  Last UDS: 07/10/2017

## 2018-03-12 ENCOUNTER — Other Ambulatory Visit: Payer: Self-pay | Admitting: Primary Care

## 2018-03-12 DIAGNOSIS — L853 Xerosis cutis: Secondary | ICD-10-CM

## 2018-03-12 MED ORDER — AMMONIUM LACTATE 12 % EX CREA: TOPICAL_CREAM | CUTANEOUS | 0 refills | Status: DC | PRN

## 2018-03-22 ENCOUNTER — Telehealth: Payer: Self-pay | Admitting: Primary Care

## 2018-03-22 DIAGNOSIS — Z298 Encounter for other specified prophylactic measures: Secondary | ICD-10-CM

## 2018-03-22 NOTE — Telephone Encounter (Signed)
Copied from CRM [id number]. Topic: Quick Communication - Rx Refill/Question >> Mar 22, 2018  1:44 PM Maia PettiesOrtiz, Kristie S wrote: Medication: [place] is going out of the country and was advised to take medication for malaria with her. Per Artelia Larocheena there is a medication we can send to drug store. Pt will be gone 8/28-9/14. Has the patient contacted their pharmacy? No - new RX Preferred Pharmacy (with phone number or street name): [id number] [place] , [place] - [address] [phone] (Phone) [phone] (Fax)

## 2018-03-22 NOTE — Telephone Encounter (Signed)
Sure, I'm happy to help. Where are they traveling to? 

## 2018-03-23 NOTE — Telephone Encounter (Signed)
Spoken and notified patient of Jocelyn GunningKate Watson's comments. Patient is going to [place].

## 2018-03-26 NOTE — Telephone Encounter (Signed)
Please notify patient that for prophylaxis against malaria they will need to start the pills two days prior to leaving, continue the pills during their stay, and continue for 7 days after they return. They will start on 05/07/18 and finish on 06/02/18. Please make sure he understands and I will send in the medication.  

## 2018-03-27 ENCOUNTER — Telehealth: Payer: Self-pay | Admitting: Primary Care

## 2018-03-27 ENCOUNTER — Ambulatory Visit: Payer: Self-pay

## 2018-03-27 MED ORDER — ATOVAQUONE-PROGUANIL HCL 250-100 MG PO TABS: ORAL_TABLET | ORAL | 0 refills | Status: DC

## 2018-03-27 NOTE — Telephone Encounter (Signed)
Incoming call from patient inquiring about medication she is to take prior to going to [place].  Provided instructions per Vernona RiegerKatherine Clark, NP. Patient voiced understanding.

## 2018-03-27 NOTE — Telephone Encounter (Signed)
Spoken and notified patient of Kate Clark's comments. Patient verbalized understanding.  

## 2018-03-27 NOTE — Telephone Encounter (Signed)
Copied from CRM [phone]#[id number]. Topic: Inquiry >> Mar 27, 2018  3:23 PM Jocelyn Watson, Jocelyn Watson, NT wrote: Reason for CRM: Patient called back and said the pharmacy does not have prescription for neither her or her husband Jocelyn Watson , for prophylaxis against malaria she was told it had been called in please advise  [phone]

## 2018-03-27 NOTE — Telephone Encounter (Signed)
Message left for patient to return my call.  PEC, please disclose the information below.

## 2018-03-27 NOTE — Telephone Encounter (Signed)
Noted, Rx sent to pharmacy. 

## 2018-03-27 NOTE — Telephone Encounter (Signed)
Never did get the message back either through the telephone message or CRM that patient was given the information. When I spoken to patient she stated that she was told that it will be sent to day. However, I was never inform that patient was notified of the information regarding the Rx.

## 2018-03-29 ENCOUNTER — Encounter: Payer: Self-pay | Admitting: Primary Care

## 2018-03-29 DIAGNOSIS — Z23 Encounter for immunization: Secondary | ICD-10-CM

## 2018-03-30 ENCOUNTER — Other Ambulatory Visit: Payer: Self-pay | Admitting: Primary Care

## 2018-03-30 DIAGNOSIS — F909 Attention-deficit hyperactivity disorder, unspecified type: Secondary | ICD-10-CM

## 2018-03-30 MED ORDER — AMPHETAMINE-DEXTROAMPHETAMINE 20 MG PO TABS: 20.0000 mg | ORAL_TABLET | Freq: Two times a day (BID) | ORAL | 0 refills | Status: DC

## 2018-03-30 MED ORDER — TYPHOID VACCINE PO CPDR: 1.0000 | DELAYED_RELEASE_CAPSULE | ORAL | 0 refills | Status: DC

## 2018-03-30 NOTE — Telephone Encounter (Signed)
Noted and refill sent. No suspicious activity noted on PMP aware. Please notify patient that she will be due for follow up of her Adderall and a UDS in October this year. She will have to be seen by then for refills after October 2019.

## 2018-03-30 NOTE — Telephone Encounter (Signed)
Name of Medication: amphetamine-dextroamphetamine (ADDERALL) 20 MG tablet  [facility]  Last Fill or Written Date and Quantity: 03/01/2018 #60  Last Office Visit and Type: 10/23/2017 for acute   Next Office Visit and Type:   Last Controlled Substance Agreement Date: 07/10/2017  Last UDS: 07/10/2017

## 2018-04-02 NOTE — Telephone Encounter (Signed)
Send patient a message through MyChart. 

## 2018-04-11 NOTE — Telephone Encounter (Signed)
Send a reminder letter as well.

## 2018-04-11 NOTE — Telephone Encounter (Signed)
Message left for patient to return my call.  

## 2018-04-29 ENCOUNTER — Other Ambulatory Visit: Payer: Self-pay

## 2018-04-29 DIAGNOSIS — F909 Attention-deficit hyperactivity disorder, unspecified type: Secondary | ICD-10-CM

## 2018-04-30 NOTE — Telephone Encounter (Signed)
Ok to refill in [place] Watson's absent.  Name of Medication: amphetamine-dextroamphetamine (ADDERALL) 20 MG tablet  [facility]  Last Fill or Written Date and Quantity: 03/30/2018 #60  Last Office Visit and Type: 10/23/2017 for acute   Next Office Visit and Type:   Last Controlled Substance Agreement Date: 07/10/2017  Last UDS: 07/10/2017

## 2018-05-01 MED ORDER — AMPHETAMINE-DEXTROAMPHETAMINE 20 MG PO TABS: 20.0000 mg | ORAL_TABLET | Freq: Two times a day (BID) | ORAL | 0 refills | Status: DC

## 2018-05-01 NOTE — Telephone Encounter (Signed)
Sent. Thanks.   

## 2018-05-18 ENCOUNTER — Other Ambulatory Visit: Payer: Self-pay | Admitting: Primary Care

## 2018-05-18 DIAGNOSIS — J309 Allergic rhinitis, unspecified: Secondary | ICD-10-CM

## 2018-05-31 ENCOUNTER — Other Ambulatory Visit: Payer: Self-pay | Admitting: Family Medicine

## 2018-05-31 DIAGNOSIS — F909 Attention-deficit hyperactivity disorder, unspecified type: Secondary | ICD-10-CM

## 2018-05-31 MED ORDER — AMPHETAMINE-DEXTROAMPHETAMINE 20 MG PO TABS: 20.0000 mg | ORAL_TABLET | Freq: Two times a day (BID) | ORAL | 0 refills | Status: DC

## 2018-05-31 NOTE — Telephone Encounter (Signed)
Name of Medication: Adderall 20mg  I po bid Name of [facility] [address]  Lavonia DanaLast Fill or Written Date and Quantity: #60, 0rf on 05/01/18 Last Office Visit and Type: 10/23/17 Acute Visit  Next Office Visit and Type: I called patient and scheduled her for a control sub visit for 06/26/18, she will need updated UDS and contract at that time.   Last Controlled Substance Agreement Date: 07/10/17  Last UDS: 07/10/17   Will pend for Jocelyn GitelmanK. Clark, NP review and approval.

## 2018-05-31 NOTE — Telephone Encounter (Signed)
Noted.  No suspicious activity noted on PMP aware site.  Refill sent to pharmacy. 

## 2018-06-26 ENCOUNTER — Ambulatory Visit: Payer: BLUE CROSS/BLUE SHIELD | Admitting: Primary Care

## 2018-07-03 ENCOUNTER — Ambulatory Visit: Payer: BLUE CROSS/BLUE SHIELD | Admitting: Primary Care

## 2018-07-03 ENCOUNTER — Encounter: Payer: Self-pay | Admitting: Primary Care

## 2018-07-03 ENCOUNTER — Encounter: Payer: Self-pay | Admitting: Emergency Medicine

## 2018-07-03 ENCOUNTER — Other Ambulatory Visit: Payer: Self-pay | Admitting: Primary Care

## 2018-07-03 DIAGNOSIS — F909 Attention-deficit hyperactivity disorder, unspecified type: Secondary | ICD-10-CM

## 2018-07-03 DIAGNOSIS — Z79899 Other long term (current) drug therapy: Secondary | ICD-10-CM

## 2018-07-03 MED ORDER — AMPHETAMINE-DEXTROAMPHETAMINE 20 MG PO TABS: 20.0000 mg | ORAL_TABLET | Freq: Two times a day (BID) | ORAL | 0 refills | Status: DC

## 2018-07-03 NOTE — Assessment & Plan Note (Signed)
Doing well on current dose of Adderall 20 mg BID. Continue same. UDS and controlled substance contract obtained today. No suspicious activity noted on PMP aware site.

## 2018-07-03 NOTE — Patient Instructions (Signed)
We will see you next year for follow up or sooner if needed.  It was a pleasure to see you today!

## 2018-07-03 NOTE — Progress Notes (Signed)
Subjective:    Patient ID: Jocelyn Watson, female    DOB: November 18, 1970, 47 y.o.   MRN: [id number]  HPI  Ms. Chaplin is a 47 year old female who presents today for follow up of ADHD with urine drug screen and controlled substance contract.  She is managed on Adderall 20 mg BID. She denies chest pain, palpitations, shortness of breath. She takes her second Adderall dose 5 days weekly total.   She feels well managed on her current regimen.  Review of Systems  Respiratory: Negative for shortness of breath.   Cardiovascular: Negative for chest pain and palpitations.  Psychiatric/Behavioral:       See HPI       Past Medical History:  Diagnosis Date  . ADHD (attention deficit hyperactivity disorder)   . Allergic rhinitis      Social History   Socioeconomic History  . Marital status: Married    Spouse name: Not on file  . Number of children: Not on file  . Years of education: Not on file  . Highest education level: Not on file  Occupational History  . Not on file  Social Needs  . Financial resource strain: Not on file  . Food insecurity:    Worry: Not on file    Inability: Not on file  . Transportation needs:    Medical: Not on file    Non-medical: Not on file  Tobacco Use  . Smoking status: Never Smoker  . Smokeless tobacco: Never Used  Substance and Sexual Activity  . Alcohol use: No    Alcohol/week: 0.0 standard drinks  . Drug use: No  . Sexual activity: Not on file  Lifestyle  . Physical activity:    Days per week: Not on file    Minutes per session: Not on file  . Stress: Not on file  Relationships  . Social connections:    Talks on phone: Not on file    Gets together: Not on file    Attends religious service: Not on file    Active member of club or organization: Not on file    Attends meetings of clubs or organizations: Not on file    Relationship status: Not on file  . Intimate partner violence:    Fear of current or ex partner: Not on file   Emotionally abused: Not on file    Physically abused: Not on file    Forced sexual activity: Not on file  Other Topics Concern  . Not on file  Social History Narrative   Married.   No Children.   Works in [employer].   Enjoys entertaining, working out.     Family History  Problem Relation Age of Onset  . Hypothyroidism Mother   . Heart disease Father   . Diabetes Maternal Aunt     Allergies  Allergen Reactions  . Codeine     REACTION: fever, vomiting    Current Outpatient Medications on File Prior to Visit  Medication Sig Dispense Refill  . ammonium lactate (AMLACTIN) 12 % cream Apply topically as needed for dry skin. MUST SCHEDULE FOLLOW UP VISIT 385 g 0  . atovaquone-proguanil (MALARONE) 250-100 MG TABS tablet Take 1 tablet by mouth daily. Start 2 days before travel, continue during travel, continue 7 days after returning from travel. 27 tablet 0  . fluticasone (FLONASE) 50 MCG/ACT nasal spray SPRAY 1 SPRAY IN EACH NOSTRIL TWICE A DAY 16 g 2  . gabapentin (NEURONTIN) 100 MG capsule Take 1  capsule (100 mg total) by mouth 2 (two) times daily as needed (sciatic pain). 90 capsule 0  . typhoid (VIVOTIF) DR capsule Take 1 capsule by mouth every other day. Must finish one week prior to travel. 4 capsule 0   No current facility-administered medications on file prior to visit.     BP 112/78   Pulse 88   Ht 5\' 9"  (1.753 m)   Wt 168 lb (76.2 kg)   LMP 06/21/2018   SpO2 97%   BMI 24.81 kg/m    Objective:   Physical Exam  Constitutional: She appears well-nourished.  Neck: Neck supple.  Cardiovascular: Normal rate and regular rhythm.  Respiratory: Effort normal and breath sounds normal.  Skin: Skin is warm and dry.  Psychiatric: She has a normal mood and affect.           Assessment & Plan:

## 2018-07-05 LAB — PAIN MGMT, PROFILE 8 W/CONF, U
6 Acetylmorphine: NEGATIVE ng/mL (ref <10)
ALCOHOL METABOLITES: NEGATIVE ng/mL (ref <500)
Amphetamine: 1339 ng/mL — ABNORMAL HIGH (ref <250)
Amphetamines: POSITIVE ng/mL — AB (ref <500)
Benzodiazepines: NEGATIVE ng/mL (ref <100)
Buprenorphine, Urine: NEGATIVE ng/mL (ref <5)
COCAINE METABOLITE: NEGATIVE ng/mL (ref <150)
Creatinine: 8.1 mg/dL — ABNORMAL LOW
MARIJUANA METABOLITE: NEGATIVE ng/mL (ref <20)
MDMA: NEGATIVE ng/mL (ref <500)
Methamphetamine: NEGATIVE ng/mL (ref <250)
OXIDANT: NEGATIVE ug/mL (ref <200)
Opiates: NEGATIVE ng/mL (ref <100)
Oxycodone: NEGATIVE ng/mL (ref <100)
SPECIFIC GRAVITY: 1.002 — AB (ref >=1.0)
pH: 6.58 (ref 4.5–9.0)

## 2018-07-30 ENCOUNTER — Other Ambulatory Visit: Payer: Self-pay

## 2018-07-30 DIAGNOSIS — L853 Xerosis cutis: Secondary | ICD-10-CM | POA: Insufficient documentation

## 2018-07-30 MED ORDER — AMMONIUM LACTATE 12 % EX CREA: TOPICAL_CREAM | CUTANEOUS | 0 refills | Status: DC | PRN

## 2018-07-30 NOTE — Telephone Encounter (Signed)
Electronic refill request Last office visit 07/03/18 Last refill 03/12/18   385 G

## 2018-07-30 NOTE — Assessment & Plan Note (Signed)
Using ammonium lactate PRN for dry skin. Refill sent to pharmacy.

## 2018-07-30 NOTE — Telephone Encounter (Signed)
Noted, refill sent to pharmacy. 

## 2018-08-04 ENCOUNTER — Other Ambulatory Visit: Payer: Self-pay

## 2018-08-04 DIAGNOSIS — F909 Attention-deficit hyperactivity disorder, unspecified type: Secondary | ICD-10-CM

## 2018-08-04 DIAGNOSIS — L853 Xerosis cutis: Secondary | ICD-10-CM

## 2018-08-06 MED ORDER — AMPHETAMINE-DEXTROAMPHETAMINE 20 MG PO TABS: 20.0000 mg | ORAL_TABLET | Freq: Two times a day (BID) | ORAL | 0 refills | Status: DC

## 2018-08-06 NOTE — Telephone Encounter (Signed)
Name of Medication:amphetamine-dextroamphetamine (ADDERALL) 20 MG tablet  [facility]  Last Fill or Written Date and Quantity:07/03/2018 #60  Last Office Visit and Type:follow up on 07/03/2018   Next Office Visit and Type:   Last Controlled Substance Agreement Date:07/03/2018  Last UDS:07/03/2018

## 2018-08-06 NOTE — Telephone Encounter (Signed)
Noted, no suspicious activity noted on PMP aware.

## 2018-08-15 DIAGNOSIS — B009 Herpesviral infection, unspecified: Secondary | ICD-10-CM

## 2018-08-15 MED ORDER — ACYCLOVIR 5 % EX OINT: 1.0000 "application " | TOPICAL_OINTMENT | CUTANEOUS | 0 refills | Status: DC

## 2018-08-17 MED ORDER — VALACYCLOVIR HCL 1 G PO TABS: 1000.0000 mg | ORAL_TABLET | Freq: Two times a day (BID) | ORAL | 0 refills | Status: DC

## 2018-08-24 ENCOUNTER — Encounter: Payer: Self-pay | Admitting: Primary Care

## 2018-08-24 ENCOUNTER — Other Ambulatory Visit: Payer: Self-pay | Admitting: Primary Care

## 2018-08-24 ENCOUNTER — Ambulatory Visit: Payer: BLUE CROSS/BLUE SHIELD | Admitting: Primary Care

## 2018-08-24 DIAGNOSIS — M25562 Pain in left knee: Secondary | ICD-10-CM | POA: Diagnosis not present

## 2018-08-24 DIAGNOSIS — M25569 Pain in unspecified knee: Secondary | ICD-10-CM | POA: Insufficient documentation

## 2018-08-24 DIAGNOSIS — Z8619 Personal history of other infectious and parasitic diseases: Secondary | ICD-10-CM | POA: Diagnosis not present

## 2018-08-24 MED ORDER — FLUTICASONE PROPIONATE 50 MCG/ACT NA SUSP: NASAL | 2 refills | Status: DC

## 2018-08-24 NOTE — Assessment & Plan Note (Signed)
Completed oral valtrex as prescribed, some improvement but without resolve.  Will have her monitor and report if symptoms do not resolve.

## 2018-08-24 NOTE — Progress Notes (Signed)
Subjective:    Patient ID: Jocelyn Watson, female    DOB: Jan 23, 1971, 47 y.o.   MRN: [id number]  HPI  Jocelyn Watson is a 47 year old female with a history of lower back pain with sciatica who presents today with a chief complaint of knee pain. She was also recently treated for cold sores  1) Acute Knee Pain: She's noticed swelling with discomfort to the left lateral knee. She recently started re-training for a half marathon two weeks ago and one week ago noticed swelling and pain to the left lateral side. She has symptoms with rest and exercise. She's wearing a knee brace when she exercises. She's notices clicking to the knee for years. She denies injury/trauma, color changes to the skin.  2) Cold Sores: Patient sent my chart message with reports of "cold sore" without improvement with Abreva. She was provided with a prescription for Valtrex at that time.   The cold sore is located to the right upper lip. Since treatment she's noticed some improvement but then will notice return of itching and irritation. She denies pain. She's compliant to Valtrex and is also using Abreva, completed her Valtrex last night.   Review of Systems  Constitutional: Negative for fever.  Musculoskeletal: Positive for arthralgias.       Acute left knee pain and swelling  Skin: Negative for color change.       Cold sore       Past Medical History:  Diagnosis Date  . ADHD (attention deficit hyperactivity disorder)   . Allergic rhinitis   . Dry skin   . Herpes simplex      Social History   Socioeconomic History  . Marital status: Married    Spouse name: Not on file  . Number of children: Not on file  . Years of education: Not on file  . Highest education level: Not on file  Occupational History  . Not on file  Social Needs  . Financial resource strain: Not on file  . Food insecurity:    Worry: Not on file    Inability: Not on file  . Transportation needs:    Medical: Not on file    Non-medical:  Not on file  Tobacco Use  . Smoking status: Never Smoker  . Smokeless tobacco: Never Used  Substance and Sexual Activity  . Alcohol use: No    Alcohol/week: 0.0 standard drinks  . Drug use: No  . Sexual activity: Not on file  Lifestyle  . Physical activity:    Days per week: Not on file    Minutes per session: Not on file  . Stress: Not on file  Relationships  . Social connections:    Talks on phone: Not on file    Gets together: Not on file    Attends religious service: Not on file    Active member of club or organization: Not on file    Attends meetings of clubs or organizations: Not on file    Relationship status: Not on file  . Intimate partner violence:    Fear of current or ex partner: Not on file    Emotionally abused: Not on file    Physically abused: Not on file    Forced sexual activity: Not on file  Other Topics Concern  . Not on file  Social History Narrative   Married.   No Children.   Works in [employer].   Enjoys entertaining, working out.     No past  surgical history on file.  Family History  Problem Relation Age of Onset  . Hypothyroidism Mother   . Heart disease Father   . Diabetes Maternal Aunt     Allergies  Allergen Reactions  . Codeine     REACTION: fever, vomiting    Current Outpatient Medications on File Prior to Visit  Medication Sig Dispense Refill  . acyclovir ointment (ZOVIRAX) 5 % Apply 1 application topically every 3 (three) hours. 5 g 0  . ammonium lactate (AMLACTIN) 12 % cream Apply topically as needed for dry skin. 385 g 0  . amphetamine-dextroamphetamine (ADDERALL) 20 MG tablet Take 1 tablet (20 mg total) by mouth 2 (two) times daily. 60 tablet 0  . fluticasone (FLONASE) 50 MCG/ACT nasal spray SPRAY 1 SPRAY IN EACH NOSTRIL TWICE A DAY 16 g 2  . gabapentin (NEURONTIN) 100 MG capsule Take 1 capsule (100 mg total) by mouth 2 (two) times daily as needed (sciatic pain). 90 capsule 0  . valACYclovir (VALTREX) 1000 MG tablet Take  1 tablet (1,000 mg total) by mouth 2 (two) times daily. 14 tablet 0   No current facility-administered medications on file prior to visit.     BP 124/82   Pulse 85   Temp 98.1 F (36.7 C) (Oral)   Ht 5\' 9"  (1.753 m)   Wt 172 lb (78 kg)   LMP 08/12/2018   SpO2 99%   BMI 25.40 kg/m    Objective:   Physical Exam  Constitutional: She appears well-nourished.  Musculoskeletal:     Left knee: She exhibits swelling. She exhibits normal range of motion, normal alignment and no bony tenderness. No tenderness found.       Legs:     Comments: Mild swelling to left lateral knee, non tender. Normal ROM. No clicking on exam. No ACL or PCL laxity.  Skin: Skin is warm and dry. No erythema.  Mild erythema with irritation to right upper lip           Assessment & Plan:

## 2018-08-24 NOTE — Assessment & Plan Note (Signed)
Acute x 1 week. Exam today consistent for bursitis. Discussed to wear brace daily, stop running, start Aleve daily for one week, elevate leg at night. She will schedule a follow up visit with sports medicine if no improvement.

## 2018-08-24 NOTE — Addendum Note (Signed)
Addended by: Tawnya CrookSAMBATH, Kristle Wesch on: 08/24/2018 11:47 AM   Modules accepted: Orders

## 2018-08-24 NOTE — Patient Instructions (Signed)
Stop running and lifting weights with the knee for one week.  Wear a brace when walking during the day.   You may take two of the Aleve (over the counter) twice daily for one week to reduce inflammation and swelling.  Elevate your knee at night when resting.  Schedule an appointment with Dr. Patsy Lageropland if no improvement.   It was a pleasure to see you today!

## 2018-08-29 ENCOUNTER — Ambulatory Visit: Payer: BLUE CROSS/BLUE SHIELD | Admitting: Primary Care

## 2018-09-07 ENCOUNTER — Other Ambulatory Visit: Payer: Self-pay

## 2018-09-07 DIAGNOSIS — F909 Attention-deficit hyperactivity disorder, unspecified type: Secondary | ICD-10-CM

## 2018-09-07 MED ORDER — AMPHETAMINE-DEXTROAMPHETAMINE 20 MG PO TABS: 20.0000 mg | ORAL_TABLET | Freq: Two times a day (BID) | ORAL | 0 refills | Status: DC

## 2018-09-07 NOTE — Telephone Encounter (Signed)
Clark pt...  Name of Medication:amphetamine-dextroamphetamine (ADDERALL) 20 MG tablet  [facility]  Last Fill or Written Date and Quantity:08/06/2018 #60  Last Office Visit and Type:follow up on 07/03/2018   Next Office Visit and Type:   Last Controlled Substance Agreement Date:07/03/2018  Last UDS:07/03/2018

## 2018-10-08 ENCOUNTER — Other Ambulatory Visit: Payer: Self-pay | Admitting: Internal Medicine

## 2018-10-08 DIAGNOSIS — F909 Attention-deficit hyperactivity disorder, unspecified type: Secondary | ICD-10-CM

## 2018-10-08 MED ORDER — AMPHETAMINE-DEXTROAMPHETAMINE 20 MG PO TABS: 20.0000 mg | ORAL_TABLET | Freq: Two times a day (BID) | ORAL | 0 refills | Status: DC

## 2018-10-08 NOTE — Telephone Encounter (Signed)
Name of Medication:Adderall 20 mg  Name of [facility]  Last Fill or Written Date and Quantity: # 60 on 09/07/18 Last Office Visit and Type: 07/03/18 ADHD Next Office Visit and Type: none scheduled Last Controlled Substance Agreement Date: 07/13/18 Last UDS:07/03/18

## 2018-10-08 NOTE — Telephone Encounter (Signed)
Noted, refill sent to pharmacy. No suspicious activity noted on PMP aware site.  

## 2018-11-10 ENCOUNTER — Other Ambulatory Visit: Payer: Self-pay

## 2018-11-10 DIAGNOSIS — F909 Attention-deficit hyperactivity disorder, unspecified type: Secondary | ICD-10-CM

## 2018-11-12 NOTE — Telephone Encounter (Signed)
Name of Medication:Adderall 20 mg   Name of Pharmacy:[facility]   Last Fill or Written Date and Quantity: # 60 on 10/08/2018  Last Office Visit and Type:08/24/2018 acute  Next Office Visit and Type:   Last Controlled Substance Agreement Date: 07/03/2018  Last UDS:07/03/18

## 2018-11-13 MED ORDER — AMPHETAMINE-DEXTROAMPHETAMINE 20 MG PO TABS: 20.0000 mg | ORAL_TABLET | Freq: Two times a day (BID) | ORAL | 0 refills | Status: DC

## 2018-11-13 NOTE — Telephone Encounter (Signed)
Noted, refill sent to pharmacy. No suspicious activity noted on PMP aware website.

## 2018-12-13 ENCOUNTER — Other Ambulatory Visit: Payer: Self-pay

## 2018-12-13 DIAGNOSIS — F909 Attention-deficit hyperactivity disorder, unspecified type: Secondary | ICD-10-CM

## 2018-12-13 MED ORDER — AMPHETAMINE-DEXTROAMPHETAMINE 20 MG PO TABS: 20.0000 mg | ORAL_TABLET | Freq: Two times a day (BID) | ORAL | 0 refills | Status: DC

## 2018-12-13 NOTE — Telephone Encounter (Signed)
Last office visit 08/24/2018 for cold sores and knee pain.  Last refilled 11/13/2018 for #60 with no refills.  No future appointments.  UDS/Contract 07/03/2018.

## 2018-12-13 NOTE — Telephone Encounter (Signed)
Noted.  No suspicious activity noted on PMP aware website. Refill sent to pharmacy. 

## 2018-12-18 NOTE — Telephone Encounter (Signed)
Jocelyn Watson, please set up either [email or web address] or [email or web address]. Thanks.

## 2018-12-20 ENCOUNTER — Other Ambulatory Visit (INDEPENDENT_AMBULATORY_CARE_PROVIDER_SITE_OTHER): Payer: BLUE CROSS/BLUE SHIELD

## 2018-12-20 ENCOUNTER — Encounter: Payer: Self-pay | Admitting: Primary Care

## 2018-12-20 ENCOUNTER — Other Ambulatory Visit: Payer: Self-pay | Admitting: Primary Care

## 2018-12-20 ENCOUNTER — Ambulatory Visit (INDEPENDENT_AMBULATORY_CARE_PROVIDER_SITE_OTHER): Payer: BLUE CROSS/BLUE SHIELD | Admitting: Primary Care

## 2018-12-20 DIAGNOSIS — B351 Tinea unguium: Secondary | ICD-10-CM

## 2018-12-20 DIAGNOSIS — Z79899 Other long term (current) drug therapy: Secondary | ICD-10-CM

## 2018-12-20 LAB — COMPREHENSIVE METABOLIC PANEL
ALT: 12 U/L (ref 0–35)
AST: 17 U/L (ref 0–37)
Albumin: 4.1 g/dL (ref 3.5–5.2)
Alkaline Phosphatase: 62 U/L (ref 39–117)
BUN: 18 mg/dL (ref 6–23)
CO2: 27 mEq/L (ref 19–32)
Calcium: 9.2 mg/dL (ref 8.4–10.5)
Chloride: 102 mEq/L (ref 96–112)
Creatinine, Ser: 0.72 mg/dL (ref 0.40–1.20)
GFR: 87.32 mL/min (ref >60.00)
Glucose, Bld: 81 mg/dL (ref 70–99)
Potassium: 4.2 mEq/L (ref 3.5–5.1)
Sodium: 136 mEq/L (ref 135–145)
Total Bilirubin: 0.4 mg/dL (ref 0.2–1.2)
Total Protein: 6.7 g/dL (ref 6.0–8.3)

## 2018-12-20 MED ORDER — TERBINAFINE HCL 250 MG PO TABS: 250.0000 mg | ORAL_TABLET | Freq: Every day | ORAL | 0 refills | Status: AC

## 2018-12-20 NOTE — Assessment & Plan Note (Signed)
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 Do believe that she will require systemic treatment for this given severity. Will start by checking labs including liver function.  Once labs have returned we will proceed with terbinafine. Will go ahead and place podiatry referral as they will not be able to see her until May/June 2020. This is in place in case our treatment fails.

## 2018-12-20 NOTE — Addendum Note (Signed)
Addended by: Doreene Nest on: 12/20/2018 10:29 AM   Modules accepted: Orders

## 2018-12-20 NOTE — Progress Notes (Signed)
Subjective:    Patient ID: Jocelyn Watson, female    DOB: 05/21/1971, 48 y.o.   MRN: [id number]  HPI  Virtual Visit via Video Note  I connected with Jocelyn Watson on 12/20/18 at 11:40 AM EDT by a video enabled telemedicine application and verified that I am speaking with the correct person using two identifiers.   I discussed the limitations of evaluation and management by telemedicine and the availability of in person appointments. The patient expressed understanding and agreed to proceed. She is at home, I am in the office.  History of Present Illness:  Jocelyn Watson is a 48 year old female with a chief complaint of toe nail fungus. 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 Her first incidence of this occurred 10 years ago and was unilateral at the time. She took a medication with resolve at that time, doesn't remember which one. Last summer her fungus returned to the bilateral toes, toenails lifted up and her nail salon had been taping them back down. She never had her nails evaluated by a provider or podiatrist.  Her recent fungus returned about one month ago, nails are lifting from the nail bed. She does run a lot, recently ran a half marathon. She doesn't believe that her nail fungus is caused by her running as she rotates good quality shoes.    Observations/Objective:  Bilateral yellow, thickened toenails to great toes. Lifting of the nail beds noted. No erythema, drainage, other deformity.   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 Do believe that she will require systemic treatment for this given severity. Will start by checking labs including liver function.  Once labs have returned we will proceed with terbinafine. Will go ahead and place podiatry referral as they will not be able to see her until May/June 2020. This is in place in case our treatment fails.  Follow Up Instructions:  We will contact you for a lab  only appointment soon.  I will be in touch with directions in regards to treatment once I receive your labs.  I did go ahead and place a podiatry referral as they won't be able to see anyone until late May/early June. This is in case our treatment fails.  It was nice to see you! Mayra Reel, NP-C    I discussed the assessment and treatment plan with the patient. The patient was provided an opportunity to ask questions and all were answered. The patient agreed with the plan and demonstrated an understanding of the instructions.   The patient was advised to call back or seek an in-person evaluation if the symptoms worsen or if the condition fails to improve as anticipated.    Doreene Nest, NP    Review of Systems  Constitutional: Negative for fever.  Skin:       Toenail discoloration and lifting  Neurological: Negative for numbness.       Past Medical History:  Diagnosis Date  . ADHD (attention deficit hyperactivity disorder)   . Allergic rhinitis   . Dry skin   . Herpes simplex      Social History   Socioeconomic History  . Marital status: Married    Spouse name: Not on file  . Number of children: Not on file  . Years of education: Not on file  . Highest education level: Not on file  Occupational History  . Not on file  Social Needs  . Financial  resource strain: Not on file  . Food insecurity:    Worry: Not on file    Inability: Not on file  . Transportation needs:    Medical: Not on file    Non-medical: Not on file  Tobacco Use  . Smoking status: Never Smoker  . Smokeless tobacco: Never Used  Substance and Sexual Activity  . Alcohol use: No    Alcohol/week: 0.0 standard drinks  . Drug use: No  . Sexual activity: Not on file  Lifestyle  . Physical activity:    Days per week: Not on file    Minutes per session: Not on file  . Stress: Not on file  Relationships  . Social connections:    Talks on phone: Not on file    Gets together: Not on file     Attends religious service: Not on file    Active member of club or organization: Not on file    Attends meetings of clubs or organizations: Not on file    Relationship status: Not on file  . Intimate partner violence:    Fear of current or ex partner: Not on file    Emotionally abused: Not on file    Physically abused: Not on file    Forced sexual activity: Not on file  Other Topics Concern  . Not on file  Social History Narrative   Married.   No Children.   Works in [employer].   Enjoys entertaining, working out.     No past surgical history on file.  Family History  Problem Relation Age of Onset  . Hypothyroidism Mother   . Heart disease Father   . Diabetes Maternal Aunt     Allergies  Allergen Reactions  . Codeine     REACTION: fever, vomiting    Current Outpatient Medications on File Prior to Visit  Medication Sig Dispense Refill  . acyclovir ointment (ZOVIRAX) 5 % Apply 1 application topically every 3 (three) hours. 5 g 0  . ammonium lactate (AMLACTIN) 12 % cream Apply topically as needed for dry skin. 385 g 0  . amphetamine-dextroamphetamine (ADDERALL) 20 MG tablet Take 1 tablet (20 mg total) by mouth 2 (two) times daily. 60 tablet 0  . fluticasone (FLONASE) 50 MCG/ACT nasal spray USE 1 SPRAY IN EACH NOSTRIL TWICE A DAY 48 g 0  . gabapentin (NEURONTIN) 100 MG capsule Take 1 capsule (100 mg total) by mouth 2 (two) times daily as needed (sciatic pain). 90 capsule 0  . valACYclovir (VALTREX) 1000 MG tablet Take 1 tablet (1,000 mg total) by mouth 2 (two) times daily. 14 tablet 0   No current facility-administered medications on file prior to visit.     There were no vitals taken for this visit.   Objective:   Physical Exam  Constitutional: She is oriented to person, place, and time. She appears well-nourished.  Respiratory: Effort normal.  Neurological: She is alert and oriented to person, place, and time.  Skin:  Bilateral yellow, thickened toenails to  great toes. Lifting of the nail beds noted. No erythema, drainage, other deformity.             Assessment & Plan:

## 2018-12-20 NOTE — Patient Instructions (Signed)
We will contact you for a lab only appointment soon.  I will be in touch with directions in regards to treatment once I receive your labs.  I did go ahead and place a podiatry referral as they won't be able to see anyone until late May/early June. This is in case our treatment fails.  It was nice to see you! Mayra Reel, NP-C

## 2019-01-11 ENCOUNTER — Other Ambulatory Visit: Payer: Self-pay

## 2019-01-11 DIAGNOSIS — F909 Attention-deficit hyperactivity disorder, unspecified type: Secondary | ICD-10-CM

## 2019-01-11 MED ORDER — AMPHETAMINE-DEXTROAMPHETAMINE 20 MG PO TABS: 20.0000 mg | ORAL_TABLET | Freq: Two times a day (BID) | ORAL | 0 refills | Status: DC

## 2019-01-11 NOTE — Telephone Encounter (Signed)
No suspicious activity noted on PMP aware web site. Urine drug screen is up-to-date. Last office visit is up-to-date. Refill sent to pharmacy.   

## 2019-01-11 NOTE — Telephone Encounter (Signed)
Name of Medication:Adderall 20 mg  Name of [facility]acy[facility]  Last Fill or Written Date and Quantity:# 60 on 12/13/2018  Last Office Visit and Type:08/24/2018 acute  Next Office Visit and Type:  Last Controlled Substance Agreement Date:07/03/2018  Last UDS:07/03/18

## 2019-01-23 ENCOUNTER — Telehealth: Payer: Self-pay | Admitting: Primary Care

## 2019-01-23 NOTE — Telephone Encounter (Signed)
Noted and glad to hear! 

## 2019-01-23 NOTE — Telephone Encounter (Signed)
Called pt to set up appt for podiatry referral.  Pt states she doesn't feel she needs referral anymore.  Pt says the medicine you prescribed worked well.  Her toenails fell completely off and new nails looks healthy with no fungus.

## 2019-01-30 LAB — HM PAP SMEAR: HM Pap smear: NEGATIVE

## 2019-02-05 ENCOUNTER — Other Ambulatory Visit: Payer: Self-pay

## 2019-02-05 DIAGNOSIS — L853 Xerosis cutis: Secondary | ICD-10-CM

## 2019-02-06 MED ORDER — AMMONIUM LACTATE 12 % EX CREA: TOPICAL_CREAM | CUTANEOUS | 0 refills | Status: DC | PRN

## 2019-02-06 NOTE — Telephone Encounter (Signed)
Last prescribed on 07/30/2018. Last appointment on 12/20/2018. No future appointment

## 2019-02-06 NOTE — Telephone Encounter (Signed)
Noted, refill sent to pharmacy. 

## 2019-02-15 ENCOUNTER — Other Ambulatory Visit: Payer: Self-pay

## 2019-02-15 DIAGNOSIS — F909 Attention-deficit hyperactivity disorder, unspecified type: Secondary | ICD-10-CM

## 2019-02-16 NOTE — Telephone Encounter (Signed)
Name of Medication:Adderall 20 mg  Name of [facility] or Written Date and Quantity:01/11/2019 #60  Last Office Visit and Type:12/20/2018 acute  Next Office Visit and Type:  Last Controlled Substance Agreement Date:07/03/2018  Last UDS:07/03/18

## 2019-02-18 MED ORDER — AMPHETAMINE-DEXTROAMPHETAMINE 20 MG PO TABS: 20.0000 mg | ORAL_TABLET | Freq: Two times a day (BID) | ORAL | 0 refills | Status: DC

## 2019-02-18 NOTE — Telephone Encounter (Signed)
Noted.  No suspicious activity noted on PMP aware site.  Refill sent to pharmacy. 

## 2019-03-20 ENCOUNTER — Other Ambulatory Visit: Payer: Self-pay

## 2019-03-20 DIAGNOSIS — F909 Attention-deficit hyperactivity disorder, unspecified type: Secondary | ICD-10-CM

## 2019-03-21 MED ORDER — AMPHETAMINE-DEXTROAMPHETAMINE 20 MG PO TABS: 20.0000 mg | ORAL_TABLET | Freq: Two times a day (BID) | ORAL | 0 refills | Status: DC

## 2019-03-21 NOTE — Telephone Encounter (Signed)
No suspicious activity noted on PMP aware website. Refill sent to pharmacy. 

## 2019-03-21 NOTE — Telephone Encounter (Signed)
Name of Medication:Adderall 20 mg  Name of [facility] [place] or Written Date and Quantity:02/18/2019 #60  Last Office Visit and Type:12/20/2018 acute  Next Office Visit and Type:  Last Controlled Substance Agreement Date:07/03/2018  Last UDS:07/03/18

## 2019-05-02 ENCOUNTER — Other Ambulatory Visit: Payer: Self-pay

## 2019-05-02 DIAGNOSIS — F909 Attention-deficit hyperactivity disorder, unspecified type: Secondary | ICD-10-CM

## 2019-05-02 MED ORDER — AMPHETAMINE-DEXTROAMPHETAMINE 20 MG PO TABS: 20.0000 mg | ORAL_TABLET | Freq: Two times a day (BID) | ORAL | 0 refills | Status: DC

## 2019-05-02 NOTE — Telephone Encounter (Signed)
Noted. No suspicious activity noted on PMP aware website.

## 2019-05-02 NOTE — Telephone Encounter (Signed)
Name of Medication: Adderall 20 mg Name of Pharmacy: [place] or Written Date and Quantity: # 46 on 03/21/19 Last Office Visit and Type:12/20/18 acute 07/03/18 ADHD  Next Office Visit and Type: none scheduled Last Controlled Substance Agreement Date:07/13/2018  Last UDS:07/03/2018

## 2019-06-02 ENCOUNTER — Other Ambulatory Visit: Payer: Self-pay

## 2019-06-02 DIAGNOSIS — F909 Attention-deficit hyperactivity disorder, unspecified type: Secondary | ICD-10-CM

## 2019-06-03 MED ORDER — AMPHETAMINE-DEXTROAMPHETAMINE 20 MG PO TABS: 20.0000 mg | ORAL_TABLET | Freq: Two times a day (BID) | ORAL | 0 refills | Status: DC

## 2019-06-03 NOTE — Telephone Encounter (Signed)
Name of Medication:Adderall 20 mg  Name of [facility] [place] or Written Date and Quantity:05/02/2019 #60  Last Office Visit and Type:4/9/2020acute  Next Office Visit and Type:  Last Controlled Substance Agreement Date:07/03/2018  Last UDS:07/03/18

## 2019-06-03 NOTE — Telephone Encounter (Signed)
Please notify patient that she is due in October 2020 for follow up of ADHD, controlled substance contact, and UDS. Please schedule.  Will refill Rx. No suspicious activity noted on PMP aware site.

## 2019-06-10 NOTE — Telephone Encounter (Signed)
Please advise. USD and contract due in October.

## 2019-06-29 ENCOUNTER — Other Ambulatory Visit: Payer: Self-pay | Admitting: Primary Care

## 2019-06-29 DIAGNOSIS — L853 Xerosis cutis: Secondary | ICD-10-CM

## 2019-07-02 ENCOUNTER — Other Ambulatory Visit: Payer: Self-pay

## 2019-07-02 DIAGNOSIS — F909 Attention-deficit hyperactivity disorder, unspecified type: Secondary | ICD-10-CM

## 2019-07-02 NOTE — Telephone Encounter (Signed)
Name of Medication:Adderall 20 mg  Name of [facility] [place] or Written Date and Quantity:06/03/2019 #60  Last Office Visit and Type:4/9/2020acute  Next Office Visit and Type:  Last Controlled Substance Agreement Date:07/03/2018  Last UDS:07/03/18

## 2019-07-03 MED ORDER — AMPHETAMINE-DEXTROAMPHETAMINE 20 MG PO TABS: 20.0000 mg | ORAL_TABLET | Freq: Two times a day (BID) | ORAL | 0 refills | Status: DC

## 2019-07-03 NOTE — Telephone Encounter (Signed)
Noted, refill sent to pharmacy. Patient is in the middle of getting new insurance and should be available for an appointment in November or December 2020. No suspicious activity noted on PMP aware site.

## 2019-07-15 ENCOUNTER — Other Ambulatory Visit: Payer: Self-pay

## 2019-07-15 DIAGNOSIS — M5432 Sciatica, left side: Secondary | ICD-10-CM

## 2019-07-15 DIAGNOSIS — M5431 Sciatica, right side: Secondary | ICD-10-CM

## 2019-07-16 MED ORDER — GABAPENTIN 100 MG PO CAPS
100.0000 mg | ORAL_CAPSULE | Freq: Two times a day (BID) | ORAL | 0 refills | Status: AC | PRN
Start: 2019-07-16 — End: ?

## 2019-07-16 NOTE — Telephone Encounter (Signed)
Last prescribed on 05/09/2017. Last appointment on 12/20/2018 . No future appointment

## 2019-07-16 NOTE — Telephone Encounter (Signed)
Noted, refill sent to pharmacy. 

## 2019-10-22 ENCOUNTER — Encounter: Payer: Self-pay | Admitting: Primary Care

## 2019-10-22 ENCOUNTER — Other Ambulatory Visit: Payer: Self-pay

## 2019-10-22 ENCOUNTER — Ambulatory Visit (INDEPENDENT_AMBULATORY_CARE_PROVIDER_SITE_OTHER): Payer: PRIVATE HEALTH INSURANCE | Admitting: Primary Care

## 2019-10-22 VITALS — BP 124/82 | HR 83 | Temp 96.2°F | Ht 69.0 in | Wt 162.8 lb

## 2019-10-22 DIAGNOSIS — B351 Tinea unguium: Secondary | ICD-10-CM

## 2019-10-22 DIAGNOSIS — Z8619 Personal history of other infectious and parasitic diseases: Secondary | ICD-10-CM

## 2019-10-22 DIAGNOSIS — Z79899 Other long term (current) drug therapy: Secondary | ICD-10-CM | POA: Diagnosis not present

## 2019-10-22 DIAGNOSIS — M5432 Sciatica, left side: Secondary | ICD-10-CM

## 2019-10-22 DIAGNOSIS — R29898 Other symptoms and signs involving the musculoskeletal system: Secondary | ICD-10-CM

## 2019-10-22 DIAGNOSIS — M5431 Sciatica, right side: Secondary | ICD-10-CM

## 2019-10-22 DIAGNOSIS — F909 Attention-deficit hyperactivity disorder, unspecified type: Secondary | ICD-10-CM | POA: Diagnosis not present

## 2019-10-22 DIAGNOSIS — L853 Xerosis cutis: Secondary | ICD-10-CM

## 2019-10-22 MED ORDER — TERBINAFINE HCL 250 MG PO TABS: 250.0000 mg | ORAL_TABLET | Freq: Every day | ORAL | 1 refills | Status: DC

## 2019-10-22 MED ORDER — AMPHETAMINE-DEXTROAMPHETAMINE 20 MG PO TABS: 20.0000 mg | ORAL_TABLET | Freq: Two times a day (BID) | ORAL | 0 refills | Status: DC

## 2019-10-22 NOTE — Assessment & Plan Note (Signed)
Doing well on PRN ammonium lactate cream. Continue same.

## 2019-10-22 NOTE — Assessment & Plan Note (Signed)
Resolved in Spring 2020, now returned. Rx for Lamisil course sent to pharmacy for 2 month course.  CMP pending.

## 2019-10-22 NOTE — Patient Instructions (Signed)
Stop by the lab prior to leaving today. I will notify you of your results once received.   You will be contacted regarding your referral to ENT.  Please let us know if you have not been contacted within two weeks.   Start terbinafine tablets for toenail fungus. Take for 2 full months.  It was a pleasure to see you today!

## 2019-10-22 NOTE — Assessment & Plan Note (Signed)
Infrequent symptoms, using gabapentin sparingly as needed. Continue same.

## 2019-10-22 NOTE — Assessment & Plan Note (Signed)
No recent outbreak.  Continue to monitor.  

## 2019-10-22 NOTE — Assessment & Plan Note (Signed)
Chronic for 3+ months. Noted on exam today. Referral placed to ENT for evaluation.  Continue night guard use.

## 2019-10-22 NOTE — Assessment & Plan Note (Signed)
Doing well on Adderall 20 mg BID. UDS updated today. Controlled substance contract updated today.  No suspicious activity noted on PMP aware web site. Refill sent to pharmacy.

## 2019-10-22 NOTE — Progress Notes (Signed)
Subjective:    Patient ID: Jocelyn Watson, female    DOB: 05/26/1971, 49 y.o.   MRN: [id number]  HPI  This visit occurred during the SARS-CoV-2 public health emergency.  Safety protocols were in place, including screening questions prior to the visit, additional usage of staff PPE, and extensive cleaning of exam room while observing appropriate contact time as indicated for disinfecting solutions.   Jocelyn Watson is a 49 year old female with a history of ADHD, bilateral intermittent sciatica, herpes simplex who presents today for follow up.  1) ADHD: Currently managed on amphetamine-dextroamphetamine 20 mg BID. She is due for UDS and controlled substance contract. Doing well on current regimen.  2) Sciatica: Currently managed on gabapentin 100 mg BID as needed for sciatic pain. No recent use of gabapentin as she's not had symptoms. She is working on Runner, broadcasting/film/video which is helping.   3) Dry Skin: Currently managed on ammonium lactate 12% cream for which she uses PRN. Doing well on this regimen.  4) Herpes Simplex: History of cold sores. Managed on PRN Acyclovir ointment and valacyclovir tablets. She denies recent cold sore breakouts.   5) Jaw Clicking: Chronic for 3+ months, occurs with all meals and when talking at times. She wears a night guard nightly for which she has done for the last 3 months, this has helped with pain. She was evaluated by her dentist last month who told her everything looked good.  BP Readings from Last 3 Encounters:  10/22/19 124/82  08/24/18 124/82  07/03/18 112/78     Review of Systems  HENT:       Jaw clicking  Respiratory: Negative for shortness of breath.   Cardiovascular: Negative for chest pain.  Musculoskeletal: Negative for back pain.  Skin:       Toenail fungus  Psychiatric/Behavioral:       See HPI       Past Medical History:  Diagnosis Date  . ADHD (attention deficit hyperactivity disorder)   . Allergic rhinitis   . Dry skin   .  Herpes simplex      Social History   Socioeconomic History  . Marital status: Married    Spouse name: Not on file  . Number of children: Not on file  . Years of education: Not on file  . Highest education level: Not on file  Occupational History  . Not on file  Tobacco Use  . Smoking status: Never Smoker  . Smokeless tobacco: Never Used  Substance and Sexual Activity  . Alcohol use: No    Alcohol/week: 0.0 standard drinks  . Drug use: No  . Sexual activity: Not on file  Other Topics Concern  . Not on file  Social History Narrative   Married.   No Children.   Works in [employer].   Enjoys entertaining, working out.    Social Determinants of Health   Financial Resource Strain:   . Difficulty of Paying Living Expenses: Not on file  Food Insecurity:   . Worried About Programme researcher, broadcasting/film/video in the Last Year: Not on file  . Ran Out of Food in the Last Year: Not on file  Transportation Needs:   . Lack of Transportation (Medical): Not on file  . Lack of Transportation (Non-Medical): Not on file  Physical Activity:   . Days of Exercise per Week: Not on file  . Minutes of Exercise per Session: Not on file  Stress:   . Feeling of Stress : Not  on file  Social Connections:   . Frequency of Communication with Friends and Family: Not on file  . Frequency of Social Gatherings with Friends and Family: Not on file  . Attends Religious Services: Not on file  . Active Member of Clubs or Organizations: Not on file  . Attends Archivist Meetings: Not on file  . Marital Status: Not on file  Intimate Partner Violence:   . Fear of Current or Ex-Partner: Not on file  . Emotionally Abused: Not on file  . Physically Abused: Not on file  . Sexually Abused: Not on file    No past surgical history on file.  Family History  Problem Relation Age of Onset  . Hypothyroidism Mother   . Heart disease Father   . Diabetes Maternal Aunt     Allergies  Allergen Reactions  .  Codeine     REACTION: fever, vomiting    Current Outpatient Medications on File Prior to Visit  Medication Sig Dispense Refill  . ammonium lactate (AMLACTIN) 12 % cream Apply topically as needed.    . fluticasone (FLONASE) 50 MCG/ACT nasal spray USE 1 SPRAY IN EACH NOSTRIL TWICE A DAY 48 g 0  . gabapentin (NEURONTIN) 100 MG capsule Take 1 capsule (100 mg total) by mouth 2 (two) times daily as needed (sciatic pain). 90 capsule 0  . zolpidem (AMBIEN) 10 MG tablet Take 10 mg by mouth at bedtime as needed.     No current facility-administered medications on file prior to visit.    BP 124/82   Pulse 83   Temp (!) 96.2 F (35.7 C) (Temporal)   Ht 5\' 9"  (1.753 m)   Wt 162 lb 12 oz (73.8 kg)   LMP 10/22/2019   SpO2 100%   BMI 24.03 kg/m    Objective:   Physical Exam  Constitutional: She appears well-nourished.  Cardiovascular: Normal rate and regular rhythm.  Respiratory: Effort normal and breath sounds normal.  Musculoskeletal:     Cervical back: Neck supple.  Skin: Skin is warm and dry.  Mild yellowing to bilateral great toenails, thickened nail to right great toenail.  Psychiatric: She has a normal mood and affect.           Assessment & Plan:

## 2019-10-23 LAB — PAIN MGMT, PROFILE 8 W/CONF, U
6 Acetylmorphine: NEGATIVE ng/mL
Alcohol Metabolites: NEGATIVE ng/mL (ref <500)
Amphetamine: 641 ng/mL
Amphetamines: POSITIVE ng/mL
Benzodiazepines: NEGATIVE ng/mL
Buprenorphine, Urine: NEGATIVE ng/mL
Cocaine Metabolite: NEGATIVE ng/mL
Creatinine: 14.3 mg/dL
MDMA: NEGATIVE ng/mL
Marijuana Metabolite: NEGATIVE ng/mL
Methamphetamine: NEGATIVE ng/mL
Opiates: NEGATIVE ng/mL
Oxidant: NEGATIVE ug/mL
Oxycodone: NEGATIVE ng/mL
Specific Gravity: 1.003 (ref >=1.0)
pH: 7.2 (ref 4.5–9.0)

## 2019-10-23 LAB — COMPREHENSIVE METABOLIC PANEL
ALT: 14 U/L (ref 0–35)
AST: 15 U/L (ref 0–37)
Albumin: 4.3 g/dL (ref 3.5–5.2)
Alkaline Phosphatase: 53 U/L (ref 39–117)
BUN: 16 mg/dL (ref 6–23)
CO2: 26 mEq/L (ref 19–32)
Calcium: 9.6 mg/dL (ref 8.4–10.5)
Chloride: 104 mEq/L (ref 96–112)
Creatinine, Ser: 0.72 mg/dL (ref 0.40–1.20)
GFR: 87 mL/min (ref >60.00)
Glucose, Bld: 84 mg/dL (ref 70–99)
Potassium: 4.3 mEq/L (ref 3.5–5.1)
Sodium: 137 mEq/L (ref 135–145)
Total Bilirubin: 0.3 mg/dL (ref 0.2–1.2)
Total Protein: 6.6 g/dL (ref 6.0–8.3)

## 2019-11-19 ENCOUNTER — Other Ambulatory Visit: Payer: Self-pay | Admitting: Primary Care

## 2019-11-22 ENCOUNTER — Other Ambulatory Visit: Payer: Self-pay

## 2019-11-22 DIAGNOSIS — F909 Attention-deficit hyperactivity disorder, unspecified type: Secondary | ICD-10-CM

## 2019-11-22 NOTE — Telephone Encounter (Signed)
Name of Medication:Adderall 20 mg  Name of Pharmacy:[facility]  Last Fill or Written Date and Quantity:10/22/2019#60  Last Office Visit and Type: 10/22/2019 follow up  Next Office Visit and Type:  Last Controlled Substance Agreement Date:10/22/2019  Last UDS:10/22/2019

## 2019-11-23 MED ORDER — AMPHETAMINE-DEXTROAMPHETAMINE 20 MG PO TABS: 20.0000 mg | ORAL_TABLET | Freq: Two times a day (BID) | ORAL | 0 refills | Status: DC

## 2019-11-23 NOTE — Telephone Encounter (Signed)
No suspicious activity noted on PMP aware web site. Urine drug screen is up-to-date. Last office visit is up-to-date. Refill sent to pharmacy.   

## 2020-01-03 ENCOUNTER — Other Ambulatory Visit: Payer: Self-pay

## 2020-01-03 DIAGNOSIS — F909 Attention-deficit hyperactivity disorder, unspecified type: Secondary | ICD-10-CM

## 2020-01-03 MED ORDER — AMPHETAMINE-DEXTROAMPHETAMINE 20 MG PO TABS: 20.0000 mg | ORAL_TABLET | Freq: Two times a day (BID) | ORAL | 0 refills | Status: DC

## 2020-01-03 NOTE — Telephone Encounter (Signed)
Name of Medication:Adderall 20 mg    Name of Pharmacy:[facility]    Last Fill or Written Date and Quantity: 11/23/2019 #60   Last Office Visit and Type: 10/22/2019 follow up   Next Office Visit and Type:    Last Controlled Substance Agreement Date: 10/22/2019   Last UDS:10/22/2019

## 2020-01-03 NOTE — Telephone Encounter (Signed)
No suspicious activity noted on PMP aware web site. Urine drug screen is up-to-date. Last office visit is up-to-date. Refill sent to pharmacy.   

## 2020-02-03 ENCOUNTER — Other Ambulatory Visit: Payer: Self-pay

## 2020-02-03 DIAGNOSIS — F909 Attention-deficit hyperactivity disorder, unspecified type: Secondary | ICD-10-CM

## 2020-02-03 NOTE — Telephone Encounter (Signed)
Name of Medication:Adderall 20 mg  Name of Pharmacy:[facility]  Last Fill or Written Date and Quantity:01/03/2020#60  Last Office Visit and Type:10/22/2019 follow up  Next Office Visit and Type:  Last Controlled Substance Agreement Date:10/22/2019  Last UDS:10/22/2019

## 2020-02-04 MED ORDER — AMPHETAMINE-DEXTROAMPHETAMINE 20 MG PO TABS: 20.0000 mg | ORAL_TABLET | Freq: Two times a day (BID) | ORAL | 0 refills | Status: DC

## 2020-02-04 NOTE — Telephone Encounter (Signed)
No suspicious activity noted on PMP aware web site. Urine drug screen is up-to-date. Last office visit is up-to-date. Refill sent to pharmacy.   

## 2020-03-12 ENCOUNTER — Other Ambulatory Visit: Payer: Self-pay

## 2020-03-12 DIAGNOSIS — F909 Attention-deficit hyperactivity disorder, unspecified type: Secondary | ICD-10-CM

## 2020-03-12 MED ORDER — AMPHETAMINE-DEXTROAMPHETAMINE 20 MG PO TABS: 20.0000 mg | ORAL_TABLET | Freq: Two times a day (BID) | ORAL | 0 refills | Status: DC

## 2020-03-12 NOTE — Telephone Encounter (Signed)
Name of Medication:Adderall 20 mg  Name of Pharmacy:[facility]  Last Fill or Written Date and Quantity:02/04/2020#60  Last Office Visit and Type:10/22/2019 follow up  Next Office Visit and Type:  Last Controlled Substance Agreement Date:10/22/2019  Last UDS:10/22/2019

## 2020-03-12 NOTE — Telephone Encounter (Signed)
No suspicious activity noted on PMP aware web site. Urine drug screen is up-to-date. Last office visit is up-to-date. Refill sent to pharmacy.   

## 2020-03-24 ENCOUNTER — Encounter (INDEPENDENT_AMBULATORY_CARE_PROVIDER_SITE_OTHER): Payer: Self-pay | Admitting: Surgical Oncology

## 2020-03-24 ENCOUNTER — Other Ambulatory Visit: Payer: Self-pay

## 2020-03-24 ENCOUNTER — Ambulatory Visit (INDEPENDENT_AMBULATORY_CARE_PROVIDER_SITE_OTHER): Payer: BC Managed Care – PPO | Admitting: Surgical Oncology

## 2020-03-24 VITALS — BP 157/97 | HR 74 | Temp 97.9°F | Ht 63.0 in | Wt 205.0 lb

## 2020-03-24 DIAGNOSIS — Z803 Family history of malignant neoplasm of breast: Secondary | ICD-10-CM

## 2020-03-24 DIAGNOSIS — Z1501 Genetic susceptibility to malignant neoplasm of breast: Secondary | ICD-10-CM

## 2020-03-24 DIAGNOSIS — Z1509 Genetic susceptibility to other malignant neoplasm: Secondary | ICD-10-CM

## 2020-03-24 DIAGNOSIS — Z1502 Genetic susceptibility to malignant neoplasm of ovary: Secondary | ICD-10-CM

## 2020-03-30 ENCOUNTER — Encounter (INDEPENDENT_AMBULATORY_CARE_PROVIDER_SITE_OTHER): Payer: Self-pay | Admitting: Surgical Oncology

## 2020-03-30 DIAGNOSIS — Z1501 Genetic susceptibility to malignant neoplasm of breast: Secondary | ICD-10-CM | POA: Insufficient documentation

## 2020-03-30 DIAGNOSIS — Z1502 Genetic susceptibility to malignant neoplasm of ovary: Secondary | ICD-10-CM | POA: Insufficient documentation

## 2020-03-30 NOTE — Progress Notes (Signed)
[place] [id number]   Medicine    Ana Perez  Date of Service: 03/24/2020    Chief Complaint:   Chief Complaint   Patient presents with   . 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  She notes that she has her mother had breast cancer at age 50. There is no other family members with breast cancer.  She denies any pancreatic cancer in her family or ovarian cancer.  She has not noticed any abnormal lumps or areas within either breast.  She denies any nipple discharge.  She does report having bilateral breast implants.          Past History  Current Outpatient Medications   Medication Sig   . estradioL 4 mcg Vaginal Insert 4 mcg by Vaginal route   . levothyroxine (SYNTHROID) 100 mcg Oral Tablet Take 1 Tab (100 mcg total) by mouth Once a day   . lisinopriL (PRINIVIL) 20 mg Oral Tablet    . LISINOPRIL (ZESTRIL ORAL) Take by mouth Once a day    . predniSONE (DELTASONE) 5 mg Oral Tablet    . Progesterone 100 mg Vaginal Suppository by Vaginal route   . venlafaxine (EFFEXOR XR) 75 mg Oral Capsule, Sust. Release 24 hr Take 75 mg by mouth Once a day     No Known Allergies  Past Medical History:   Diagnosis Date   . Esophageal reflux    . Hypertension    . Hypothyroidism    . Irritable bowel syndrome    . Multinodular goiter          Past Surgical History:   Procedure Laterality Date   . HX BREAST AUGMENTATION     . HX CESAREAN SECTION     . HX HYSTERECTOMY           Family Medical History:     Problem Relation (Age of Onset)    Breast Cancer Mother    Heart Attack Father    Heart Disease Father            Social History     Socioeconomic History   . Marital status: Married     Spouse name: Not on file   . Number of children: Not on file   . Years of education: Not on file   . Highest education level: Not on file   Tobacco Use   . Smoking status: Never  Smoker   . Smokeless tobacco: Never Used   Substance and Sexual Activity   . Alcohol use: No   . Drug use: No     Social Determinants of Health     Financial Resource Strain:    . Difficulty of Paying Living Expenses:    Food Insecurity:    . Worried About Charity fundraiser in the Last Year:    . Arboriculturist in the Last Year:    Transportation Needs:    . Film/video editor (Medical):    Marland Kitchen Lack of Transportation (Non-Medical):    Physical Activity:    . Days of Exercise per Week:    . Minutes of Exercise per Session:    Stress:    . Feeling of Stress :    Intimate Partner Violence:    . Fear of Current  or Ex-Partner:    . Emotionally Abused:    Marland Kitchen Physically Abused:    . Sexually Abused:        Review of Systems   Constitutional: Negative for unexpected weight change.   Allergic/Immunologic: Negative for immunocompromised state.   Hematological: Does not bruise/bleed easily.       Examination  Vitals: BP (!) 157/97   Pulse 74   Temp 36.6 C (97.9 F)   Ht 1.6 m ('5\' 3"'$ )   Wt 93 kg (205 lb)   BMI 36.31 kg/m       Physical Exam  Vitals reviewed. Exam conducted with a chaperone present.   Constitutional:       General: She is not in acute distress.     Appearance: Normal appearance.   Eyes:      General: No scleral icterus.  Pulmonary:      Effort: Pulmonary effort is normal.   Chest:      Breasts:         Right: Normal. No inverted nipple, mass, nipple discharge or skin change.         Left: Normal. No inverted nipple, mass, nipple discharge or skin change.   Lymphadenopathy:      Upper Body:      Right upper body: No supraclavicular or axillary adenopathy.      Left upper body: No supraclavicular or axillary adenopathy.   Skin:     General: Skin is warm and dry.   Neurological:      General: No focal deficit present.      Mental Status: She is alert and oriented to person, place, and time.   Psychiatric:         Mood and Affect: Mood normal.         Behavior: Behavior normal.         Thought Content:  Thought content normal.         Judgment: Judgment normal.       Ortho Exam    Results  Mammogram from May of 2021 reviewed by me:  BI-RADS 1    Diagnosis and Plan  1. 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  I explained the risk of developing breast cancer.  I explained the option of risk reduction bilateral mastectomies versus close monitoring.  I also explained the possibility of bilateral breast reconstruction with risk reduction mastectomies.  At this point, she would prefer close surveillance.  I explained that we would alternate with a mammogram and a breast MRI every 6 months.  Since her mammogram was just in May, I would plan to order a breast MRI in November of 2021. She notes that she is scheduled to have her ovaries removed in the fall.  I will plan to see her again following her breast MRI for another clinical exam.  She is to call sooner if needed.  All questions were answered.        I appreciate the ability to help care for this patient.  I will keep you informed about the patient's progress.    Orders Placed This Encounter   . MRI BREAST BILATERAL W/WO CONTRAST [[id number]]       Charna Busman, MD, FACS  Assistant Professor  Department of Surgery, Surgical Oncology  [place]

## 2020-03-30 NOTE — Patient Instructions (Signed)
Notify the office with any questions or problems.

## 2020-04-07 ENCOUNTER — Telehealth: Payer: Self-pay

## 2020-04-07 NOTE — Telephone Encounter (Signed)
10-22-19 ENT Referral closed.  Spoke w/pt 12-09-19.  Pt to get records from her dentist w/i next day or two. Records need to confirm TMJ r/o already.  Pt has not responded to request for dental records.  LMOM 5-4, 5-7, 01-28-20

## 2020-04-07 NOTE — Telephone Encounter (Signed)
Noted  

## 2020-04-18 ENCOUNTER — Other Ambulatory Visit: Payer: Self-pay

## 2020-04-18 DIAGNOSIS — F909 Attention-deficit hyperactivity disorder, unspecified type: Secondary | ICD-10-CM

## 2020-04-20 MED ORDER — AMPHETAMINE-DEXTROAMPHETAMINE 20 MG PO TABS: 20.0000 mg | ORAL_TABLET | Freq: Two times a day (BID) | ORAL | 0 refills | Status: DC

## 2020-04-20 NOTE — Telephone Encounter (Signed)
Name of Medication:Adderall 20 mg  Name of Pharmacy:[facility]  Last Fill or Written Date and Quantity:03/12/2020#60  Last Office Visit and Type:10/22/2019 follow up  Next Office Visit and Type:  Last Controlled Substance Agreement Date:10/22/2019  Last UDS:10/22/2019

## 2020-05-25 ENCOUNTER — Other Ambulatory Visit: Payer: Self-pay | Admitting: Internal Medicine

## 2020-05-25 DIAGNOSIS — F909 Attention-deficit hyperactivity disorder, unspecified type: Secondary | ICD-10-CM

## 2020-05-25 MED ORDER — AMPHETAMINE-DEXTROAMPHETAMINE 20 MG PO TABS: 20.0000 mg | ORAL_TABLET | Freq: Two times a day (BID) | ORAL | 0 refills | Status: DC

## 2020-05-25 NOTE — Telephone Encounter (Signed)
No suspicious activity noted on PMP aware web site. Urine drug screen is up-to-date. Last office visit is up-to-date. Refill sent to pharmacy.   

## 2020-05-25 NOTE — Telephone Encounter (Signed)
LAST APPOINTMENT DATE: 10/22/2019 With UDS    NEXT APPOINTMENT DATE: Visit date not found    LAST REFILL: 04/20/2020  Bid  QTY: #60 no refills

## 2020-06-25 ENCOUNTER — Other Ambulatory Visit: Payer: Self-pay

## 2020-06-25 DIAGNOSIS — F909 Attention-deficit hyperactivity disorder, unspecified type: Secondary | ICD-10-CM

## 2020-06-26 MED ORDER — AMPHETAMINE-DEXTROAMPHETAMINE 20 MG PO TABS: 20.0000 mg | ORAL_TABLET | Freq: Two times a day (BID) | ORAL | 0 refills | Status: DC

## 2020-06-26 NOTE — Telephone Encounter (Signed)
No suspicious activity noted on PMP aware web site. Urine drug screen is up-to-date. Last office visit is up-to-date. Refill sent to pharmacy.   

## 2020-06-26 NOTE — Telephone Encounter (Signed)
LAST APPOINTMENT DATE: 10/22/2019   NEXT APPOINTMENT DATE: Visit date not found    LAST REFILL: 05/25/2020   QTY: #60 no rf     .

## 2020-07-30 ENCOUNTER — Other Ambulatory Visit: Payer: Self-pay

## 2020-07-30 DIAGNOSIS — F909 Attention-deficit hyperactivity disorder, unspecified type: Secondary | ICD-10-CM

## 2020-08-04 ENCOUNTER — Other Ambulatory Visit: Payer: Self-pay

## 2020-08-04 DIAGNOSIS — F909 Attention-deficit hyperactivity disorder, unspecified type: Secondary | ICD-10-CM

## 2020-08-04 MED ORDER — AMPHETAMINE-DEXTROAMPHETAMINE 20 MG PO TABS: 20.0000 mg | ORAL_TABLET | Freq: Two times a day (BID) | ORAL | 0 refills | Status: DC

## 2020-08-04 NOTE — Telephone Encounter (Signed)
LAST APPOINTMENT DATE: 10/22/2019   NEXT APPOINTMENT DATE: Visit date not found    LAST REFILL: 06/25/2020  QTY: 60

## 2020-08-04 NOTE — Telephone Encounter (Signed)
Name of Medication: Amphetamine-Dextroamphetamine 20 mg  Name of Pharmacy: [id number] [place], [place]  Last Fill or Written Date and Quantity: 06/25/2020 30-R0 Last Office Visit and Type: 10/22/2019 Next Office Visit and Type: Not Scheduled  Last Controlled Substance Agreement Date: 10/22/2019 Last UDS: 10/22/2019

## 2020-08-04 NOTE — Telephone Encounter (Signed)
No suspicious activity noted on PMP aware web site. Urine drug screen is up-to-date. Last office visit is up-to-date. Refill sent to pharmacy.   

## 2020-09-04 ENCOUNTER — Other Ambulatory Visit: Payer: Self-pay

## 2020-09-04 DIAGNOSIS — F909 Attention-deficit hyperactivity disorder, unspecified type: Secondary | ICD-10-CM

## 2020-09-07 MED ORDER — AMPHETAMINE-DEXTROAMPHETAMINE 20 MG PO TABS: 20.0000 mg | ORAL_TABLET | Freq: Two times a day (BID) | ORAL | 0 refills | Status: DC

## 2020-09-07 NOTE — Telephone Encounter (Signed)
LAST APPOINTMENT DATE: 10/22/2019  NEXT APPOINTMENT DATE: Visit date not found    LAST REFILL: 08/04/2020   QTY: #60 no rf

## 2020-10-08 ENCOUNTER — Other Ambulatory Visit: Payer: Self-pay | Admitting: Internal Medicine

## 2020-10-08 DIAGNOSIS — F909 Attention-deficit hyperactivity disorder, unspecified type: Secondary | ICD-10-CM

## 2020-10-08 MED ORDER — AMPHETAMINE-DEXTROAMPHETAMINE 20 MG PO TABS: 20.0000 mg | ORAL_TABLET | Freq: Two times a day (BID) | ORAL | 0 refills | Status: DC

## 2020-10-08 NOTE — Telephone Encounter (Signed)
Please notify patient that she needs to schedule an appointment for follow-up of her ADHD in order to receive any further refills of her Adderall.  I will refill now, but cannot refill again until she is seen. Needs updated UDS and contract.

## 2020-10-08 NOTE — Telephone Encounter (Signed)
LAST APPOINTMENT DATE:10/22/2019  NEXT APPOINTMENT DATE: Visit date not found    LAST REFILL: 09/07/2020  QTY: 60

## 2020-10-12 NOTE — Telephone Encounter (Signed)
Please call patient for f/u

## 2020-10-13 NOTE — Telephone Encounter (Signed)
Called patient and scheduled 2/15.

## 2020-10-27 ENCOUNTER — Encounter: Payer: Self-pay | Admitting: Primary Care

## 2020-10-27 ENCOUNTER — Ambulatory Visit (INDEPENDENT_AMBULATORY_CARE_PROVIDER_SITE_OTHER): Payer: 59 | Admitting: Primary Care

## 2020-10-27 ENCOUNTER — Other Ambulatory Visit: Payer: Self-pay

## 2020-10-27 VITALS — BP 124/76 | HR 76 | Temp 98.6°F | Ht 69.0 in | Wt 162.0 lb

## 2020-10-27 DIAGNOSIS — F909 Attention-deficit hyperactivity disorder, unspecified type: Secondary | ICD-10-CM

## 2020-10-27 DIAGNOSIS — E079 Disorder of thyroid, unspecified: Secondary | ICD-10-CM

## 2020-10-27 DIAGNOSIS — M5432 Sciatica, left side: Secondary | ICD-10-CM

## 2020-10-27 DIAGNOSIS — M5431 Sciatica, right side: Secondary | ICD-10-CM

## 2020-10-27 DIAGNOSIS — L853 Xerosis cutis: Secondary | ICD-10-CM | POA: Diagnosis not present

## 2020-10-27 DIAGNOSIS — H6122 Impacted cerumen, left ear: Secondary | ICD-10-CM | POA: Diagnosis not present

## 2020-10-27 DIAGNOSIS — H6120 Impacted cerumen, unspecified ear: Secondary | ICD-10-CM | POA: Insufficient documentation

## 2020-10-27 DIAGNOSIS — Z79899 Other long term (current) drug therapy: Secondary | ICD-10-CM

## 2020-10-27 NOTE — Patient Instructions (Signed)
Stop by the lab prior to leaving today. I will notify you of your results once received.   It was a pleasure to see you today!  

## 2020-10-27 NOTE — Assessment & Plan Note (Signed)
Chronic, intermittent, taking gabapentin 100 mg sparingly, continue same.

## 2020-10-27 NOTE — Assessment & Plan Note (Signed)
Noted to left canal. Patient consented to irrigation. Tolerated well. TM and canal unremarkable post irrigation.

## 2020-10-27 NOTE — Assessment & Plan Note (Signed)
Following with a physician through [facility] who has her managed on Armour Thyroid 60 mg and Cytomel 5 mg.

## 2020-10-27 NOTE — Progress Notes (Signed)
Subjective:    Patient ID: Jocelyn Watson, female    DOB: 02/15/1971, 50 y.o.   MRN: [id number]  HPI  This visit occurred during the SARS-CoV-2 public health emergency.  Safety protocols were in place, including screening questions prior to the visit, additional usage of staff PPE, and extensive cleaning of exam room while observing appropriate contact time as indicated for disinfecting solutions.   Jocelyn Watson is a 50 year old female with a history of sciatica, ADHD who presents today for follow up. She also believes that her left ear is impacted with cerumen.  1) ADHD: Currently managed on Adderall 20 mg BID. She is due today for her UDS and to update her controlled substance contract. She doesn't typically take her afternoon dose if she's not working, this occurs about twice weekly.   2) Back Pain with Sciatica: Currently managed on gabapentin 100 mg BID PRN. Currently involved with strength training which has helped some. She's using gabapentin infrequently overall.   3) Thyroid Disorder: Currently following with a doctor at [facility], receiving hormonal pellets every 4 months. Her doctor has been monitoring her thyroid over time and was initiated on Armour Thyroid 60 mg and Cytomel 5 mcg about on year ago. She was also told 2-3 weeks ago that she has EBV and CMV, treated with antibiotics for which she completed last week.   4) Sleep Disorder: Currently managed on Ambien 10 mg for which she takes seldomly, takes 3-4 times annually on average.   Review of Systems  HENT:       Left ear fullness  Respiratory: Negative for shortness of breath.   Cardiovascular: Negative for chest pain.  Musculoskeletal: Negative for back pain.  Psychiatric/Behavioral: Negative for decreased concentration and sleep disturbance.       See HPI       Past Medical History:  Diagnosis Date  . ADHD (attention deficit hyperactivity disorder)   . Allergic rhinitis   . Dry skin   . Herpes  simplex      Social History   Socioeconomic History  . Marital status: Married    Spouse name: Not on file  . Number of children: Not on file  . Years of education: Not on file  . Highest education level: Not on file  Occupational History  . Not on file  Tobacco Use  . Smoking status: Never Smoker  . Smokeless tobacco: Never Used  Substance and Sexual Activity  . Alcohol use: No    Alcohol/week: 0.0 standard drinks  . Drug use: No  . Sexual activity: Not on file  Other Topics Concern  . Not on file  Social History Narrative   Married.   No Children.   Works in [employer].   Enjoys entertaining, working out.    Social Determinants of Health   Financial Resource Strain: Not on file  Food Insecurity: Not on file  Transportation Needs: Not on file  Physical Activity: Not on file  Stress: Not on file  Social Connections: Not on file  Intimate Partner Violence: Not on file    History reviewed. No pertinent surgical history.  Family History  Problem Relation Age of Onset  . Hypothyroidism Mother   . Heart disease Father   . Diabetes Maternal Aunt     Allergies  Allergen Reactions  . Codeine     REACTION: fever, vomiting    Current Outpatient Medications on File Prior to Visit  Medication Sig Dispense Refill  . ammonium  lactate (AMLACTIN) 12 % cream Apply topically as needed.    Marland Kitchen amphetamine-dextroamphetamine (ADDERALL) 20 MG tablet Take 1 tablet (20 mg total) by mouth 2 (two) times daily. 60 tablet 0  . ARMOUR THYROID 60 MG tablet Take 60 mg by mouth daily.    . fluticasone (FLONASE) 50 MCG/ACT nasal spray USE 1 SPRAY IN EACH NOSTRIL TWICE A DAY 48 g 0  . gabapentin (NEURONTIN) 100 MG capsule Take 1 capsule (100 mg total) by mouth 2 (two) times daily as needed (sciatic pain). 90 capsule 0  . liothyronine (CYTOMEL) 5 MCG tablet 5 mcg.    . zolpidem (AMBIEN) 10 MG tablet Take 10 mg by mouth at bedtime as needed.     No current facility-administered  medications on file prior to visit.    BP 124/76   Pulse 76   Temp 98.6 F (37 C) (Temporal)   Ht 5\' 9"  (1.753 m)   Wt 162 lb (73.5 kg)   SpO2 99%   BMI 23.92 kg/m    Objective:   Physical Exam Constitutional:      Appearance: She is well-nourished.  HENT:     Ears:     Comments: Left canal with soft cerumen impaction. Cardiovascular:     Rate and Rhythm: Normal rate and regular rhythm.  Pulmonary:     Effort: Pulmonary effort is normal.     Breath sounds: Normal breath sounds.  Musculoskeletal:     Cervical back: Neck supple.  Skin:    General: Skin is warm and dry.  Psychiatric:        Mood and Affect: Mood and affect normal.            Assessment & Plan:

## 2020-10-27 NOTE — Assessment & Plan Note (Signed)
Doing well on Adderall 20 mg BID, does skip afternoon dose if she's not working, this occurs twice weekly.  Continue Adderall 20 mg BID. UDS pending. Controlled substance contract updated. PMP aware site checked monthly, no suspicious activity.

## 2020-10-27 NOTE — Assessment & Plan Note (Signed)
Doing well on ammonium lactate PRN, continue same.

## 2020-10-28 DIAGNOSIS — L853 Xerosis cutis: Secondary | ICD-10-CM

## 2020-10-28 MED ORDER — AMMONIUM LACTATE 12 % EX CREA: TOPICAL_CREAM | CUTANEOUS | 0 refills | Status: DC | PRN

## 2020-10-28 NOTE — Telephone Encounter (Signed)
You have not given in the past ok to refill?

## 2020-10-29 LAB — DRUG MONITORING, PANEL 8 WITH CONFIRMATION, URINE
6 Acetylmorphine: NEGATIVE ng/mL (ref <10)
Alcohol Metabolites: NEGATIVE ng/mL
Amphetamine: 570 ng/mL — ABNORMAL HIGH (ref <250)
Amphetamines: POSITIVE ng/mL — AB (ref <500)
Benzodiazepines: NEGATIVE ng/mL (ref <100)
Buprenorphine, Urine: NEGATIVE ng/mL (ref <5)
Cocaine Metabolite: NEGATIVE ng/mL (ref <150)
Creatinine: 9.6 mg/dL
MDMA: NEGATIVE ng/mL (ref <500)
Marijuana Metabolite: NEGATIVE ng/mL (ref <20)
Methamphetamine: NEGATIVE ng/mL (ref <250)
Opiates: NEGATIVE ng/mL (ref <100)
Oxidant: NEGATIVE ug/mL
Oxycodone: NEGATIVE ng/mL (ref <100)
Specific Gravity: 1.003 (ref >=1.0)
pH: 7.1 (ref 4.5–9.0)

## 2020-10-29 LAB — DM TEMPLATE

## 2020-11-06 ENCOUNTER — Encounter: Payer: Self-pay | Admitting: Primary Care

## 2020-11-11 ENCOUNTER — Other Ambulatory Visit: Payer: Self-pay

## 2020-11-11 DIAGNOSIS — F909 Attention-deficit hyperactivity disorder, unspecified type: Secondary | ICD-10-CM

## 2020-11-11 MED ORDER — AMPHETAMINE-DEXTROAMPHETAMINE 20 MG PO TABS: 20.0000 mg | ORAL_TABLET | Freq: Two times a day (BID) | ORAL | 0 refills | Status: DC

## 2020-11-11 NOTE — Telephone Encounter (Signed)
LAST APPOINTMENT DATE:10/27/2020   NEXT APPOINTMENT DATE: Visit date not found    LAST REFILL:10/08/2020  QTY:60  UDS and contract updated 10/27/2020

## 2020-11-11 NOTE — Telephone Encounter (Signed)
No suspicious activity noted on PMP aware web site. Urine drug screen is up-to-date. Last office visit is up-to-date. Refill sent to pharmacy.   

## 2020-12-07 DIAGNOSIS — E079 Disorder of thyroid, unspecified: Secondary | ICD-10-CM

## 2020-12-21 ENCOUNTER — Other Ambulatory Visit: Payer: Self-pay

## 2020-12-21 DIAGNOSIS — F909 Attention-deficit hyperactivity disorder, unspecified type: Secondary | ICD-10-CM

## 2020-12-22 MED ORDER — AMPHETAMINE-DEXTROAMPHETAMINE 20 MG PO TABS: 20.0000 mg | ORAL_TABLET | Freq: Two times a day (BID) | ORAL | 0 refills | Status: DC

## 2020-12-22 NOTE — Telephone Encounter (Signed)
No suspicious activity noted on PMP aware web site. Urine drug screen is up-to-date. Last office visit is up-to-date. Refill sent to pharmacy.   

## 2020-12-23 ENCOUNTER — Other Ambulatory Visit: Payer: Self-pay

## 2020-12-23 DIAGNOSIS — J302 Other seasonal allergic rhinitis: Secondary | ICD-10-CM

## 2020-12-23 MED ORDER — FLUTICASONE PROPIONATE 50 MCG/ACT NA SUSP: NASAL | 0 refills | Status: DC

## 2020-12-23 NOTE — Telephone Encounter (Signed)
Refill request Flonase Last refill 08/24/18   48G Last office visit 10/27/20

## 2020-12-23 NOTE — Telephone Encounter (Signed)
Refills sent to pharmacy. 

## 2021-01-21 ENCOUNTER — Other Ambulatory Visit: Payer: Self-pay

## 2021-01-21 DIAGNOSIS — F909 Attention-deficit hyperactivity disorder, unspecified type: Secondary | ICD-10-CM

## 2021-01-21 MED ORDER — AMPHETAMINE-DEXTROAMPHETAMINE 20 MG PO TABS: 20.0000 mg | ORAL_TABLET | Freq: Two times a day (BID) | ORAL | 0 refills | Status: DC

## 2021-01-21 NOTE — Telephone Encounter (Signed)
No suspicious activity noted on PMP aware web site. Urine drug screen is up-to-date. Last office visit is up-to-date. Refill sent to pharmacy.   

## 2021-01-21 NOTE — Telephone Encounter (Signed)
Name of Medication: Adderall 20 mg Name of Pharmacy: [facility] Last [place] or Written Date and Quantity: # 60 on 12/22/20 Last Office Visit and Type: 10/27/20 ADHD Next Office Visit and Type: none scheduled Last Controlled Substance Agreement Date: 10/29/20 Last UDS:10/27/20

## 2021-01-21 NOTE — Telephone Encounter (Signed)
FYI: Please see updates on the endocrinology referral. I did notify patient in April about needing lab results also but have not heard any feedback from the patient yet.  Thank you

## 2021-01-21 NOTE — Telephone Encounter (Signed)
FYI

## 2021-02-09 ENCOUNTER — Other Ambulatory Visit: Payer: Self-pay | Admitting: Obstetrics and Gynecology

## 2021-02-09 DIAGNOSIS — R928 Other abnormal and inconclusive findings on diagnostic imaging of breast: Secondary | ICD-10-CM

## 2021-02-10 ENCOUNTER — Other Ambulatory Visit: Payer: Self-pay

## 2021-02-10 DIAGNOSIS — F909 Attention-deficit hyperactivity disorder, unspecified type: Secondary | ICD-10-CM

## 2021-02-15 MED ORDER — AMPHETAMINE-DEXTROAMPHETAMINE 20 MG PO TABS: 20.0000 mg | ORAL_TABLET | Freq: Two times a day (BID) | ORAL | 0 refills | Status: DC

## 2021-02-15 NOTE — Telephone Encounter (Signed)
LAST APPOINTMENT DATE: 01/21/2021   NEXT APPOINTMENT DATE: Visit date not found    LAST REFILL: 01/21/2021   QTY: 60  UDS and contract done 10/27/2020

## 2021-02-15 NOTE — Telephone Encounter (Signed)
No suspicious activity noted on PMP aware web site. Urine drug screen is up-to-date. Last office visit is up-to-date. Refill sent to pharmacy.   

## 2021-02-24 ENCOUNTER — Ambulatory Visit: Admission: RE | Admit: 2021-02-24 | Payer: 59 | Source: Ambulatory Visit

## 2021-02-24 ENCOUNTER — Other Ambulatory Visit: Payer: Self-pay

## 2021-02-24 ENCOUNTER — Ambulatory Visit
Admission: RE | Admit: 2021-02-24 | Discharge: 2021-02-24 | Disposition: A | Discharge status: Home / Self Care | Payer: 59 | Source: Ambulatory Visit | Attending: Obstetrics and Gynecology | Admitting: Obstetrics and Gynecology

## 2021-02-24 DIAGNOSIS — R928 Other abnormal and inconclusive findings on diagnostic imaging of breast: Secondary | ICD-10-CM

## 2021-02-26 ENCOUNTER — Other Ambulatory Visit: Payer: Self-pay

## 2021-02-26 DIAGNOSIS — F909 Attention-deficit hyperactivity disorder, unspecified type: Secondary | ICD-10-CM

## 2021-03-02 MED ORDER — AMPHETAMINE-DEXTROAMPHETAMINE 20 MG PO TABS: 20.0000 mg | ORAL_TABLET | Freq: Two times a day (BID) | ORAL | 0 refills | Status: DC

## 2021-03-02 NOTE — Telephone Encounter (Signed)
No suspicious activity noted on PMP aware web site. Urine drug screen is up-to-date. Last office visit is up-to-date. Refill sent to pharmacy.   

## 2021-03-21 ENCOUNTER — Other Ambulatory Visit: Payer: Self-pay | Admitting: Primary Care

## 2021-03-21 DIAGNOSIS — J302 Other seasonal allergic rhinitis: Secondary | ICD-10-CM

## 2021-04-02 ENCOUNTER — Other Ambulatory Visit: Payer: Self-pay

## 2021-04-02 DIAGNOSIS — F909 Attention-deficit hyperactivity disorder, unspecified type: Secondary | ICD-10-CM

## 2021-04-07 ENCOUNTER — Other Ambulatory Visit: Payer: Self-pay

## 2021-04-07 DIAGNOSIS — F909 Attention-deficit hyperactivity disorder, unspecified type: Secondary | ICD-10-CM

## 2021-04-08 MED ORDER — AMPHETAMINE-DEXTROAMPHETAMINE 20 MG PO TABS: 20.0000 mg | ORAL_TABLET | Freq: Two times a day (BID) | ORAL | 0 refills | Status: DC

## 2021-05-10 ENCOUNTER — Other Ambulatory Visit: Payer: Self-pay

## 2021-05-10 DIAGNOSIS — F909 Attention-deficit hyperactivity disorder, unspecified type: Secondary | ICD-10-CM

## 2021-05-11 MED ORDER — AMPHETAMINE-DEXTROAMPHETAMINE 20 MG PO TABS: 20.0000 mg | ORAL_TABLET | Freq: Two times a day (BID) | ORAL | 0 refills | Status: DC

## 2021-06-12 ENCOUNTER — Other Ambulatory Visit: Payer: Self-pay

## 2021-06-12 DIAGNOSIS — F909 Attention-deficit hyperactivity disorder, unspecified type: Secondary | ICD-10-CM

## 2021-06-15 MED ORDER — AMPHETAMINE-DEXTROAMPHETAMINE 20 MG PO TABS: 20.0000 mg | ORAL_TABLET | Freq: Two times a day (BID) | ORAL | 0 refills | Status: DC

## 2021-06-30 IMAGING — MG MM DIGITAL DIAGNOSTIC UNILAT*R* IMPLANT W/ TOMO W/ CAD
6 series · 6 of 18 positions shown · non-contrast
Comparison: Previous exam(s).

CLINICAL DATA: Possible mass in the medial right breast

EXAM:
DIGITAL DIAGNOSTIC UNILATERAL RIGHT MAMMOGRAM WITH IMPLANTS, CAD AND
TOMOSYNTHESIS
TECHNIQUE: Right digital diagnostic mammography and breast tomosynthesis was
performed. The images were evaluated with computer-aided detection.
Standard and/or implant displaced views were performed.

[R CC synth-2D (1 of 2)]
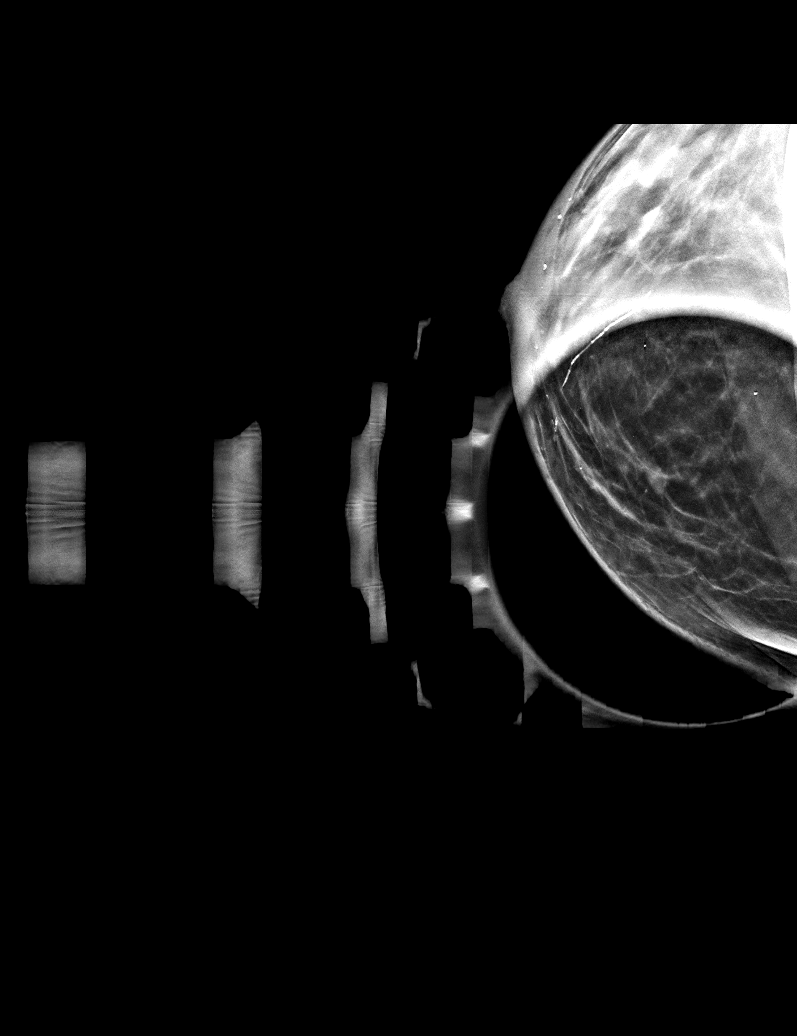

[R CC synth-2D (2 of 2)]
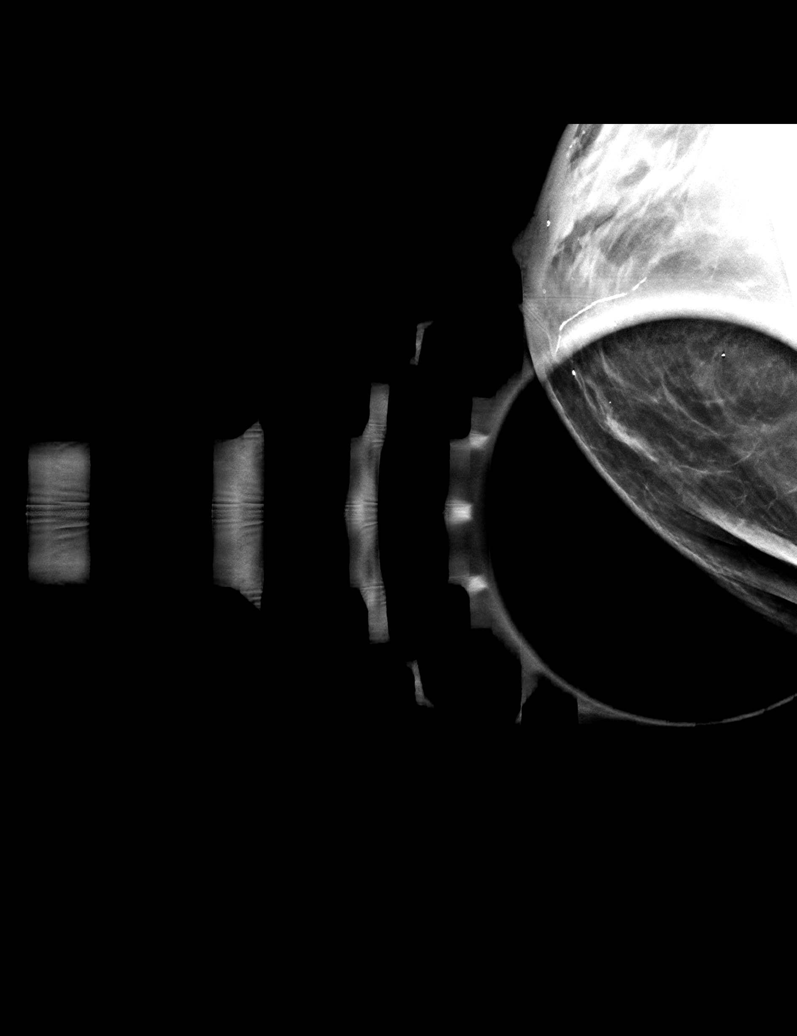

[R ML synth-2D]
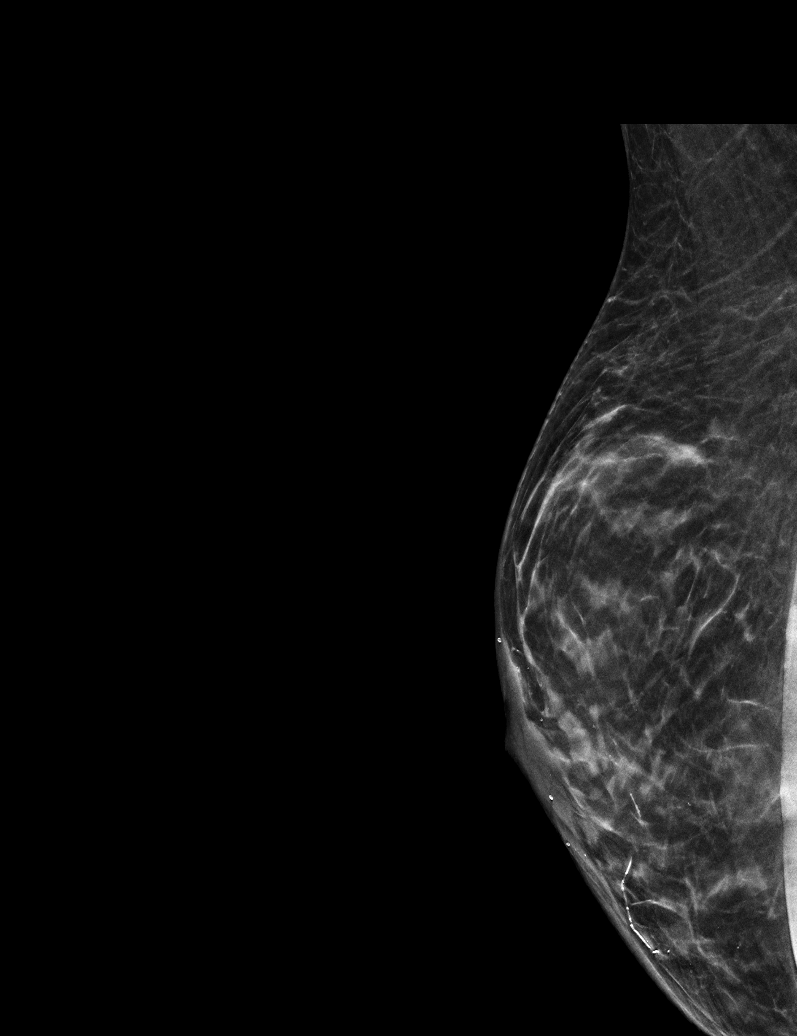

[R CCID BREAST TOMOSYNTHESIS IMAGE tomo (1 of 2) · tomo slice 18/35.0]
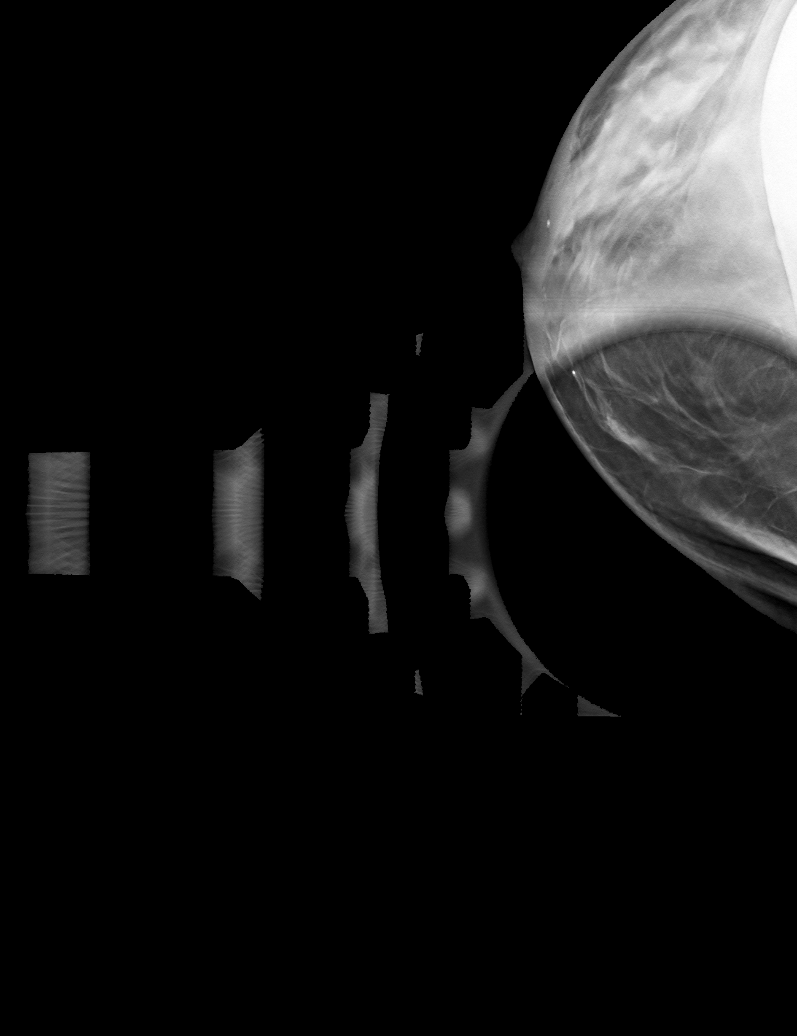

[R MLID BREAST TOMOSYNTHESIS IMAGE tomo · tomo slice 21/40.0]
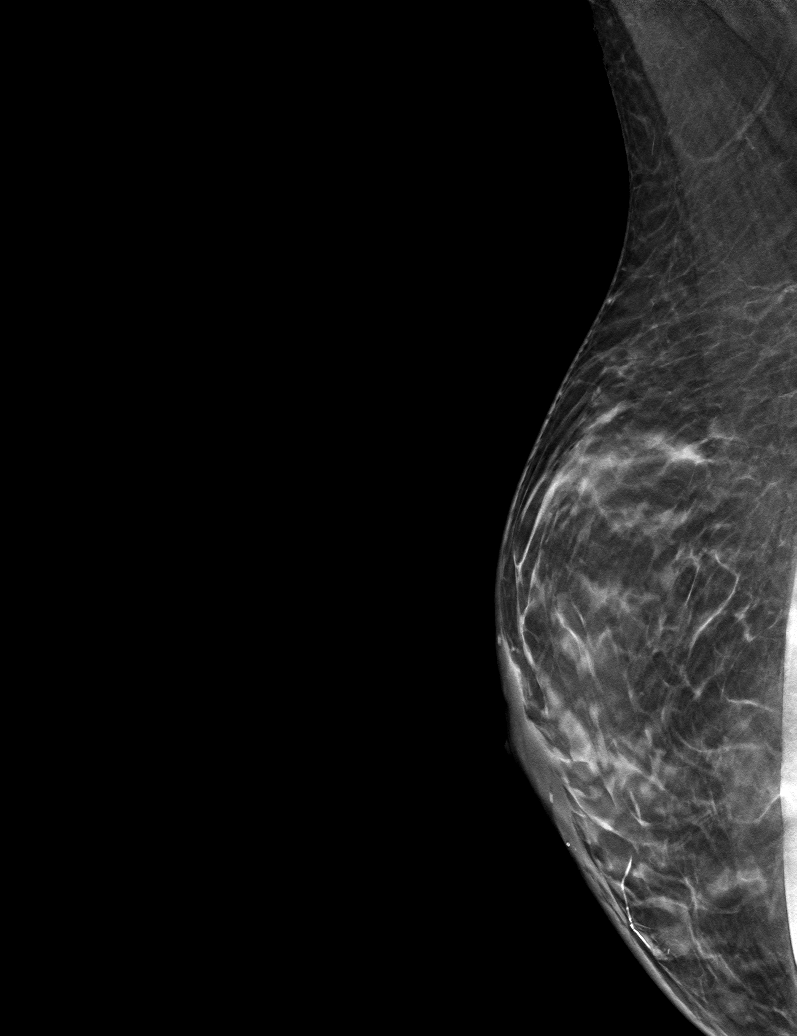

[R CCID BREAST TOMOSYNTHESIS IMAGE tomo (2 of 2) · tomo slice 20/39.0]
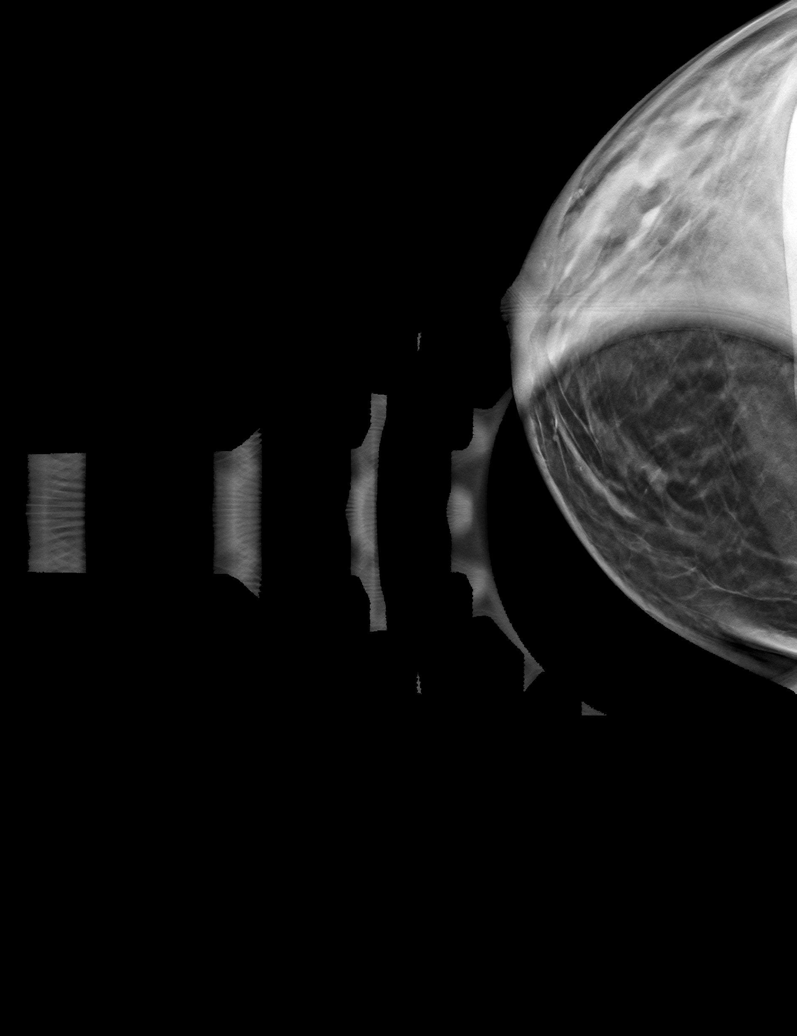

[6 of 18 positions shown; findings below may reference images not displayed]

ACR Breast Density Category c: The breast tissue is heterogeneously
dense, which may obscure small masses.
FINDINGS: Normal appearing fibroglandular tissue at the location of the
recently suspected mass in the medial right breast, unchanged
compared to previous examinations. The patient has a retropectoral
implant.
IMPRESSION: No evidence of malignancy. The recently suspected right breast mass
was close apposition of normal breast tissue.

RECOMMENDATION:
Bilateral screening mammogram in 1 year when due.

I have discussed the findings and recommendations with the patient.
If applicable, a reminder letter will be sent to the patient
regarding the next appointment.

BI-RADS CATEGORY  1: Negative.

## 2021-07-16 ENCOUNTER — Other Ambulatory Visit: Payer: Self-pay

## 2021-07-16 DIAGNOSIS — F909 Attention-deficit hyperactivity disorder, unspecified type: Secondary | ICD-10-CM

## 2021-07-16 MED ORDER — AMPHETAMINE-DEXTROAMPHETAMINE 20 MG PO TABS: 20.0000 mg | ORAL_TABLET | Freq: Two times a day (BID) | ORAL | 0 refills | Status: DC

## 2021-08-17 ENCOUNTER — Other Ambulatory Visit: Payer: Self-pay | Admitting: Primary Care

## 2021-08-17 DIAGNOSIS — F909 Attention-deficit hyperactivity disorder, unspecified type: Secondary | ICD-10-CM

## 2021-08-17 MED ORDER — AMPHETAMINE-DEXTROAMPHETAMINE 20 MG PO TABS: 20.0000 mg | ORAL_TABLET | Freq: Two times a day (BID) | ORAL | 0 refills | Status: DC

## 2021-09-07 ENCOUNTER — Other Ambulatory Visit: Payer: Self-pay | Admitting: Primary Care

## 2021-09-07 DIAGNOSIS — L853 Xerosis cutis: Secondary | ICD-10-CM

## 2021-09-07 MED ORDER — AMMONIUM LACTATE 12 % EX CREA: TOPICAL_CREAM | CUTANEOUS | 0 refills | Status: AC | PRN

## 2021-09-16 ENCOUNTER — Other Ambulatory Visit: Payer: Self-pay | Admitting: Primary Care

## 2021-09-16 DIAGNOSIS — F909 Attention-deficit hyperactivity disorder, unspecified type: Secondary | ICD-10-CM

## 2021-09-16 MED ORDER — AMPHETAMINE-DEXTROAMPHETAMINE 20 MG PO TABS: 20.0000 mg | ORAL_TABLET | Freq: Two times a day (BID) | ORAL | 0 refills | Status: DC

## 2021-10-26 ENCOUNTER — Ambulatory Visit (INDEPENDENT_AMBULATORY_CARE_PROVIDER_SITE_OTHER): Payer: Self-pay | Admitting: Primary Care

## 2021-10-26 ENCOUNTER — Other Ambulatory Visit: Payer: Self-pay

## 2021-10-26 ENCOUNTER — Encounter: Payer: Self-pay | Admitting: Primary Care

## 2021-10-26 VITALS — BP 124/78 | HR 78 | Temp 98.6°F | Ht 69.0 in | Wt 167.0 lb

## 2021-10-26 DIAGNOSIS — M5432 Sciatica, left side: Secondary | ICD-10-CM

## 2021-10-26 DIAGNOSIS — M5431 Sciatica, right side: Secondary | ICD-10-CM

## 2021-10-26 DIAGNOSIS — F909 Attention-deficit hyperactivity disorder, unspecified type: Secondary | ICD-10-CM

## 2021-10-26 DIAGNOSIS — Z8619 Personal history of other infectious and parasitic diseases: Secondary | ICD-10-CM

## 2021-10-26 DIAGNOSIS — E079 Disorder of thyroid, unspecified: Secondary | ICD-10-CM

## 2021-10-26 DIAGNOSIS — Z79899 Other long term (current) drug therapy: Secondary | ICD-10-CM

## 2021-10-26 DIAGNOSIS — L853 Xerosis cutis: Secondary | ICD-10-CM

## 2021-10-26 MED ORDER — AMPHETAMINE-DEXTROAMPHETAMINE 20 MG PO TABS: 20.0000 mg | ORAL_TABLET | Freq: Two times a day (BID) | ORAL | 0 refills | Status: DC

## 2021-10-26 NOTE — Assessment & Plan Note (Signed)
No recent outbreaks. ? ?Continue to monitor. ?

## 2021-10-26 NOTE — Progress Notes (Signed)
Subjective:    Patient ID: Jocelyn Watson, female    DOB: May 30, 1971, 51 y.o.   MRN: [id number]  HPI  Jocelyn Watson is a very pleasant 51 y.o. female with a history of thyroid disorder, ADHD, HSV 1, cerumen impaction, chronic back pain with sciatica who presents today for follow up of chronic conditions and medication refills.   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 Currently managed on gabapentin 100 mg BID for which she takes as needed for flares.  Today she endorses she feels well managed. No recent flares.   2) Thyroid Disorder: Previously following with endocrinology, Dr. Tedd Sias, now seeing Dr. Eduard Clos in [place] and is now managed on Armour Thyroid 120 mg daily and Cytomel 25 mcg daily. Last TSH in her chart was 1.8 in November 2022. She has had a thyroid panel since in January 2023.   Today she has been on her new thyroid regimen for 2 weeks.   3) ADHD: Currently managed on Adderall 20 mg BID. She denies chest pain, palpitations, headaches. She doesn't take her afternoon dose if she doesn't need it.   4) Insomnia: Currently managed on Ambien 10 mg for which she takes sparingly. She endorses 1-2 years to go through one prescription.     Review of Systems  Respiratory:  Negative for shortness of breath.   Cardiovascular:  Negative for chest pain and palpitations.  Musculoskeletal:  Negative for arthralgias.  Neurological:  Negative for numbness.        Past Medical History:  Diagnosis Date   ADHD (attention deficit hyperactivity disorder)    Allergic rhinitis    Dry skin    Herpes simplex     Social History   Socioeconomic History   Marital status: Married    Spouse name: Not on file   Number of children: Not on file   Years of education: Not on file   Highest education level: Not on file  Occupational History   Not on file  Tobacco Use   Smoking status: Never   Smokeless tobacco: Never  Substance and Sexual Activity   Alcohol use: No     Alcohol/week: 0.0 standard drinks   Drug use: No   Sexual activity: Not on file  Other Topics Concern   Not on file  Social History Narrative   Married.   No Children.   Works in [employer].   Enjoys entertaining, working out.    Social Determinants of Health   Financial Resource Strain: Not on file  Food Insecurity: Not on file  Transportation Needs: Not on file  Physical Activity: Not on file  Stress: Not on file  Social Connections: Not on file  Intimate Partner Violence: Not on file    History reviewed. No pertinent surgical history.  Family History  Problem Relation Age of Onset   Hypothyroidism Mother    Heart disease Father    Diabetes Maternal Aunt     Allergies  Allergen Reactions   Codeine     REACTION: fever, vomiting    Current Outpatient Medications on File Prior to Visit  Medication Sig Dispense Refill   ammonium lactate (AMLACTIN) 12 % cream Apply topically as needed. 385 g 0   amphetamine-dextroamphetamine (ADDERALL) 20 MG tablet Take 1 tablet (20 mg total) by mouth 2 (two) times daily. Due in February 2023 for office visit. 60 tablet 0   ARMOUR THYROID 60 MG tablet Take 120 mg by mouth daily.     fluticasone (FLONASE)  50 MCG/ACT nasal spray SPRAY 1 SPRAY INTO EACH NOSTRIL TWICE A DAY 48 mL 1   gabapentin (NEURONTIN) 100 MG capsule Take 1 capsule (100 mg total) by mouth 2 (two) times daily as needed (sciatic pain). 90 capsule 0   liothyronine (CYTOMEL) 5 MCG tablet 25 mcg.     zolpidem (AMBIEN) 10 MG tablet Take 10 mg by mouth at bedtime as needed.     No current facility-administered medications on file prior to visit.    BP 124/78    Pulse 78    Temp 98.6 F (37 C) (Oral)    Ht 5\' 9"  (1.753 m)    Wt 167 lb (75.8 kg)    SpO2 96%    BMI 24.66 kg/m  Objective:   Physical Exam Cardiovascular:     Rate and Rhythm: Normal rate and regular rhythm.  Pulmonary:     Effort: Pulmonary effort is normal.     Breath sounds: Normal breath sounds.   Musculoskeletal:     Cervical back: Neck supple.  Skin:    General: Skin is warm and dry.  Psychiatric:        Mood and Affect: Mood normal.          Assessment & Plan:      This visit occurred during the SARS-CoV-2 public health emergency.  Safety protocols were in place, including screening questions prior to the visit, additional usage of staff PPE, and extensive cleaning of exam room while observing appropriate contact time as indicated for disinfecting solutions.

## 2021-10-26 NOTE — Assessment & Plan Note (Signed)
Controlled.  Continue gabapentin 100 mg BID PRN.

## 2021-10-26 NOTE — Patient Instructions (Signed)
Stop by the lab prior to leaving today. I will notify you of your results once received.   It was a pleasure to see you today!  

## 2021-10-26 NOTE — Assessment & Plan Note (Signed)
Controlled.  Continue ammonium lactate 12% cream PRN.

## 2021-10-26 NOTE — Assessment & Plan Note (Addendum)
Working with Dr. Eduard Clos in [place].  Continue Thyroid Armor 120 mg daily and Cytomel 25 mcg.  Reviewed endocrinology notes and labs from November 2022 through [facility].

## 2021-10-26 NOTE — Assessment & Plan Note (Signed)
Controlled. Continue Adderall 20 mg BID.  Updated controlled substance contract.  Urine drug screen due and pending.

## 2021-10-29 LAB — DRUG MONITORING, PANEL 8 WITH CONFIRMATION, URINE
6 Acetylmorphine: NEGATIVE ng/mL (ref <10)
Alcohol Metabolites: NEGATIVE ng/mL (ref <500)
Amphetamine: 816 ng/mL — ABNORMAL HIGH (ref <250)
Amphetamines: POSITIVE ng/mL — AB (ref <500)
Benzodiazepines: NEGATIVE ng/mL (ref <100)
Buprenorphine, Urine: NEGATIVE ng/mL (ref <5)
Cocaine Metabolite: NEGATIVE ng/mL (ref <150)
Creatinine: 34.2 mg/dL (ref >=20.0)
MDMA: NEGATIVE ng/mL (ref <500)
Marijuana Metabolite: NEGATIVE ng/mL (ref <20)
Methamphetamine: NEGATIVE ng/mL (ref <250)
Opiates: NEGATIVE ng/mL (ref <100)
Oxidant: NEGATIVE ug/mL (ref <200)
Oxycodone: NEGATIVE ng/mL (ref <100)
pH: 5.9 (ref 4.5–9.0)

## 2021-10-29 LAB — DM TEMPLATE

## 2021-11-05 DIAGNOSIS — J302 Other seasonal allergic rhinitis: Secondary | ICD-10-CM

## 2021-11-07 MED ORDER — FLUTICASONE PROPIONATE 50 MCG/ACT NA SUSP: NASAL | 1 refills | Status: AC

## 2021-11-30 ENCOUNTER — Other Ambulatory Visit: Payer: Self-pay | Admitting: Primary Care

## 2021-11-30 DIAGNOSIS — F909 Attention-deficit hyperactivity disorder, unspecified type: Secondary | ICD-10-CM

## 2021-11-30 MED ORDER — AMPHETAMINE-DEXTROAMPHETAMINE 20 MG PO TABS: 20.0000 mg | ORAL_TABLET | Freq: Two times a day (BID) | ORAL | 0 refills | Status: DC

## 2021-12-09 ENCOUNTER — Other Ambulatory Visit: Payer: Self-pay

## 2022-01-25 ENCOUNTER — Encounter: Payer: Self-pay | Admitting: Primary Care

## 2022-03-12 HISTORY — PX: OTHER SURGICAL HISTORY: SHX169

## 2023-02-28 ENCOUNTER — Encounter: Payer: Self-pay | Admitting: Primary Care

## 2023-02-28 ENCOUNTER — Ambulatory Visit: Payer: 59 | Admitting: Primary Care

## 2023-02-28 VITALS — BP 112/72 | HR 94 | Temp 98.1°F | Ht 69.0 in | Wt 151.0 lb

## 2023-02-28 DIAGNOSIS — M5431 Sciatica, right side: Secondary | ICD-10-CM

## 2023-02-28 DIAGNOSIS — E079 Disorder of thyroid, unspecified: Secondary | ICD-10-CM | POA: Diagnosis not present

## 2023-02-28 DIAGNOSIS — M5432 Sciatica, left side: Secondary | ICD-10-CM

## 2023-02-28 DIAGNOSIS — L853 Xerosis cutis: Secondary | ICD-10-CM

## 2023-02-28 DIAGNOSIS — F909 Attention-deficit hyperactivity disorder, unspecified type: Secondary | ICD-10-CM

## 2023-02-28 DIAGNOSIS — Z79899 Other long term (current) drug therapy: Secondary | ICD-10-CM

## 2023-02-28 MED ORDER — AMPHETAMINE-DEXTROAMPHETAMINE 20 MG PO TABS: 20.0000 mg | ORAL_TABLET | Freq: Two times a day (BID) | ORAL | 0 refills | Status: DC

## 2023-02-28 NOTE — Patient Instructions (Addendum)
Stop by the lab prior to leaving today. I will notify you of your results once received.   It was a pleasure to see you today!  

## 2023-02-28 NOTE — Assessment & Plan Note (Signed)
Controlled. Continue ammonium lactate 12%.

## 2023-02-28 NOTE — Assessment & Plan Note (Signed)
Uncontrolled since off regimen. Updated controlled substance contract and UDS today.  Resume Adderall 20 mg BID. No suspicious activity historically on PDMP website.

## 2023-02-28 NOTE — Assessment & Plan Note (Signed)
Following with Dr. Eduard Clos.  Continue Cytomel 5 mcg daily.

## 2023-02-28 NOTE — Assessment & Plan Note (Signed)
Controlled. ° °Continue gabapentin 100 mg BID PRN. ° °

## 2023-02-28 NOTE — Progress Notes (Signed)
Subjective:    Patient ID: Jocelyn Watson, female    DOB: 1971/05/24, 52 y.o.   MRN: [id number]  Medication Refill Pertinent negatives include no chest pain or headaches.    Jocelyn Watson is a very pleasant 52 y.o. female with a history of ADHD, herpes simplex type I, bilateral back pain with sciatica, thyroid disorder who presents today for follow-up of chronic conditions.  1) ADHD: Currently managed on Adderall 20 mg twice daily for which she has been taking for years.  She feels well managed REGIMEN and would like to continue.  She took herself off of Adderall one year ago as she went on Adipex for three months. Since being off of her Adderall she's had a harder time focusing at work. She would like to resume.   2) Thyroid Disorder: Following with Dr. Eduard Clos and is managed on Cytomel 5 mcg daily. Overall feels well managed. She has an upcoming appointment for next month.   She is also now managed on finasteride 1 mg daily for hair thinning.   3) Back Pain/Sciatica: Currently managed on gabapentin 100 mg twice daily as needed.  She historically uses this sparingly. She's been working on stretching and her sciatica has improved.   4) Insomnia: Currently managed on zolpidem 10 mg at bedtime as needed.  This is prescribed by her gynecologist. She uses this sparingly.   BP Readings from Last 3 Encounters:  02/28/23 112/72  10/26/21 124/78  10/27/20 124/76   Wt Readings from Last 3 Encounters:  02/28/23 151 lb (68.5 kg)  10/26/21 167 lb (75.8 kg)  10/27/20 162 lb (73.5 kg)      Review of Systems  Respiratory:  Negative for shortness of breath.   Cardiovascular:  Negative for chest pain and palpitations.  Gastrointestinal:  Negative for constipation and diarrhea.  Musculoskeletal:  Negative for back pain.  Neurological:  Negative for headaches.         Past Medical History:  Diagnosis Date   ADHD (attention deficit hyperactivity disorder)    Allergic rhinitis     Dry skin    Herpes simplex     Social History   Socioeconomic History   Marital status: Married    Spouse name: Not on file   Number of children: Not on file   Years of education: Not on file   Highest education level: 12th grade  Occupational History   Not on file  Tobacco Use   Smoking status: Never   Smokeless tobacco: Never  Substance and Sexual Activity   Alcohol use: No    Alcohol/week: 0.0 standard drinks of alcohol   Drug use: No   Sexual activity: Not on file  Other Topics Concern   Not on file  Social History Narrative   Married.   No Children.   Works in [employer].   Enjoys entertaining, working out.    Social Determinants of Health   Financial Resource Strain: Patient Declined (02/27/2023)   Overall Financial Resource Strain (CARDIA)    Difficulty of Paying Living Expenses: Patient declined  Food Insecurity: Unknown (02/27/2023)   Hunger Vital Sign    Worried About Running Out of Food in the Last Year: Patient declined    Ran Out of Food in the Last Year: Never true  Transportation Needs: No Transportation Needs (02/27/2023)   PRAPARE - Administrator, Civil Service (Medical): No    Lack of Transportation (Non-Medical): No  Physical Activity: Insufficiently Active (02/27/2023)  Exercise Vital Sign    Days of Exercise per Week: 4 days    Minutes of Exercise per Session: 30 min  Stress: No Stress Concern Present (02/27/2023)   [employer] of Occupational Health - Occupational Stress Questionnaire    Feeling of Stress : Only a little  Social Connections: Unknown (02/27/2023)   Social Connection and Isolation Panel [NHANES]    Frequency of Communication with Friends and Family: More than three times a week    Frequency of Social Gatherings with Friends and Family: More than three times a week    Attends Religious Services: More than 4 times per year    Active Member of [employer] or Organizations: Patient declined    Attends Tax inspector Meetings: Not on file    Marital Status: Married  Catering manager Violence: Not on file    Past Surgical History:  Procedure Laterality Date   Cataract surgery Bilateral 03/2022    Family History  Problem Relation Age of Onset   Hypothyroidism Mother    Heart disease Father    Diabetes Maternal Aunt     Allergies  Allergen Reactions   Codeine     REACTION: fever, vomiting    Current Outpatient Medications on File Prior to Visit  Medication Sig Dispense Refill   ammonium lactate (AMLACTIN) 12 % cream Apply topically as needed. 385 g 0   ARMOUR THYROID 60 MG tablet Take 120 mg by mouth daily.     finasteride (PROPECIA) 1 MG tablet TAKE 1 TABLET ONCE A DAY. FOR HAIR THINNING     fluticasone (FLONASE) 50 MCG/ACT nasal spray SPRAY 1 SPRAY INTO EACH NOSTRIL TWICE A DAY 48 mL 1   liothyronine (CYTOMEL) 5 MCG tablet 25 mcg.     zolpidem (AMBIEN) 10 MG tablet Take 10 mg by mouth at bedtime as needed.     gabapentin (NEURONTIN) 100 MG capsule Take 1 capsule (100 mg total) by mouth 2 (two) times daily as needed (sciatic pain). (Patient not taking: Reported on 02/28/2023) 90 capsule 0   No current facility-administered medications on file prior to visit.    BP 112/72   Pulse 94   Temp 98.1 F (36.7 C) (Temporal)   Ht 5\' 9"  (1.753 m)   Wt 151 lb (68.5 kg)   LMP 02/01/2023   SpO2 98%   BMI 22.30 kg/m  Objective:   Physical Exam Cardiovascular:     Rate and Rhythm: Normal rate and regular rhythm.  Pulmonary:     Effort: Pulmonary effort is normal.     Breath sounds: Normal breath sounds.  Abdominal:     Palpations: Abdomen is soft.     Tenderness: There is no abdominal tenderness.  Musculoskeletal:     Cervical back: Neck supple.  Skin:    General: Skin is warm and dry.  Neurological:     Mental Status: She is alert.  Psychiatric:        Mood and Affect: Mood normal.           Assessment & Plan:  Thyroid disorder Assessment & Plan: Following  with Dr. Eduard Clos.  Continue Cytomel 5 mcg daily.   Bilateral sciatica Assessment & Plan: Controlled.  Continue gabapentin 100 mg BID PRN.    Dry skin Assessment & Plan: Controlled. Continue ammonium lactate 12%.    Attention deficit hyperactivity disorder (ADHD), unspecified ADHD type Assessment & Plan: Uncontrolled since off regimen. Updated controlled substance contract and UDS today.  Resume Adderall 20 mg BID.  No suspicious activity historically on PDMP website.  Orders: -     DRUG MONITORING, PANEL 8 WITH CONFIRMATION, URINE -     Amphetamine-Dextroamphetamine; Take 1 tablet (20 mg total) by mouth 2 (two) times daily.  Dispense: 60 tablet; Refill: 0  High risk medication use -     DRUG MONITORING, PANEL 8 WITH CONFIRMATION, URINE        Doreene Nest, NP

## 2023-03-02 LAB — DRUG MONITORING, PANEL 8 WITH CONFIRMATION, URINE
6 Acetylmorphine: NEGATIVE ng/mL (ref <10)
Alcohol Metabolites: NEGATIVE ng/mL (ref <500)
Amphetamine: 1245 ng/mL — ABNORMAL HIGH (ref <250)
Amphetamines: POSITIVE ng/mL — AB (ref <500)
Benzodiazepines: NEGATIVE ng/mL (ref <100)
Buprenorphine, Urine: NEGATIVE ng/mL (ref <5)
Cocaine Metabolite: NEGATIVE ng/mL (ref <150)
Creatinine: 10.6 mg/dL — ABNORMAL LOW (ref >=20.0)
MDMA: NEGATIVE ng/mL (ref <500)
Marijuana Metabolite: NEGATIVE ng/mL (ref <20)
Methamphetamine: NEGATIVE ng/mL (ref <250)
Opiates: NEGATIVE ng/mL (ref <100)
Oxidant: NEGATIVE ug/mL (ref <200)
Oxycodone: NEGATIVE ng/mL (ref <100)
Specific Gravity: 1.003 (ref >=1.003)
pH: 5.3 (ref 4.5–9.0)

## 2023-03-02 LAB — DM TEMPLATE

## 2023-04-03 ENCOUNTER — Other Ambulatory Visit: Payer: Self-pay | Admitting: Primary Care

## 2023-04-03 DIAGNOSIS — F909 Attention-deficit hyperactivity disorder, unspecified type: Secondary | ICD-10-CM

## 2023-04-03 MED ORDER — AMPHETAMINE-DEXTROAMPHETAMINE 20 MG PO TABS: 20.0000 mg | ORAL_TABLET | Freq: Two times a day (BID) | ORAL | 0 refills | Status: DC

## 2023-05-08 ENCOUNTER — Other Ambulatory Visit: Payer: Self-pay | Admitting: Primary Care

## 2023-05-08 DIAGNOSIS — F909 Attention-deficit hyperactivity disorder, unspecified type: Secondary | ICD-10-CM

## 2023-05-08 MED ORDER — AMPHETAMINE-DEXTROAMPHETAMINE 20 MG PO TABS: 20.0000 mg | ORAL_TABLET | Freq: Two times a day (BID) | ORAL | 0 refills | Status: DC

## 2023-06-08 ENCOUNTER — Other Ambulatory Visit: Payer: Self-pay | Admitting: Primary Care

## 2023-06-08 DIAGNOSIS — F909 Attention-deficit hyperactivity disorder, unspecified type: Secondary | ICD-10-CM

## 2023-06-08 MED ORDER — AMPHETAMINE-DEXTROAMPHETAMINE 20 MG PO TABS: 20.0000 mg | ORAL_TABLET | Freq: Two times a day (BID) | ORAL | 0 refills | Status: DC

## 2023-07-11 ENCOUNTER — Other Ambulatory Visit: Payer: Self-pay | Admitting: Primary Care

## 2023-07-11 DIAGNOSIS — F909 Attention-deficit hyperactivity disorder, unspecified type: Secondary | ICD-10-CM

## 2023-07-11 MED ORDER — AMPHETAMINE-DEXTROAMPHETAMINE 20 MG PO TABS: 20.0000 mg | ORAL_TABLET | Freq: Two times a day (BID) | ORAL | 0 refills | Status: DC

## 2023-08-15 ENCOUNTER — Other Ambulatory Visit: Payer: Self-pay | Admitting: Primary Care

## 2023-08-15 ENCOUNTER — Encounter: Payer: Self-pay | Admitting: Primary Care

## 2023-08-15 ENCOUNTER — Telehealth (INDEPENDENT_AMBULATORY_CARE_PROVIDER_SITE_OTHER): Payer: 59 | Admitting: Primary Care

## 2023-08-15 DIAGNOSIS — F909 Attention-deficit hyperactivity disorder, unspecified type: Secondary | ICD-10-CM

## 2023-08-15 DIAGNOSIS — R051 Acute cough: Secondary | ICD-10-CM | POA: Insufficient documentation

## 2023-08-15 MED ORDER — BENZONATATE 200 MG PO CAPS: 200.0000 mg | ORAL_CAPSULE | Freq: Three times a day (TID) | ORAL | 0 refills | Status: DC | PRN

## 2023-08-15 MED ORDER — AZITHROMYCIN 250 MG PO TABS: ORAL_TABLET | ORAL | 0 refills | Status: DC

## 2023-08-15 MED ORDER — AMPHETAMINE-DEXTROAMPHETAMINE 20 MG PO TABS: 20.0000 mg | ORAL_TABLET | Freq: Two times a day (BID) | ORAL | 0 refills | Status: DC

## 2023-08-15 NOTE — Assessment & Plan Note (Signed)
Symptoms suggestive of viral etiology, and exam is limited given virtual platform. Given her husband's prior illness that required antibiotic treatment, coupled with presentation today, will treat.  Start Azithromycin antibiotics for infection. Take 2 tablets by mouth today, then 1 tablet daily for 4 additional days. Start [employer]. Take 1 capsule by mouth three times daily as needed for cough.  Fluids, rest, follow up PRN

## 2023-08-15 NOTE — Patient Instructions (Signed)
Start Azithromycin antibiotics for infection. Take 2 tablets by mouth today, then 1 tablet daily for 4 additional days.  You may take Benzonatate capsules for cough. Take 1 capsule by mouth three times daily as needed for cough.  It was a pleasure to see you today!  

## 2023-08-15 NOTE — Progress Notes (Signed)
Patient ID: Jocelyn Watson, female    DOB: 11/30/1970, 52 y.o.   MRN: [id number]  Virtual visit completed through [employer], a video enabled telemedicine application. Due to national recommendations of social distancing due to COVID-19, a virtual visit is felt to be most appropriate for this patient at this time. Reviewed limitations, risks, security and privacy concerns of performing a virtual visit and the availability of in person appointments. I also reviewed that there may be a patient responsible charge related to this service. The patient agreed to proceed.   Patient location: home Provider location: [place] [facility], office Persons participating in this virtual visit: patient, provider   If any vitals were documented, they were collected by patient at home unless specified below.    There were no vitals taken for this visit.   CC: Acute Cough Subjective:   HPI: Jocelyn Watson is a 52 y.o. female with a history of allergic rhinitis presenting on 08/15/2023 for Cough (Symptoms started 3 days ago  Dry Cough, fatigued, chills, sweats, congestion, ear pain, SOB/Declines fever, sore throat/Tested negative for Covid on Sunday)  Symptom onset three evenings ago with chills, sweats, fatigue. She then developed cough with chest congestion, shortness of breath.   She's been taking Delsym, Dayquil, Nyquil, Mucinex, Tessalon Perles.   She took a home Covid-19 test two days ago which was negative. She was exposed to her husband who had the same symptoms one week prior. He required antibiotic treatment. His symptoms have resolved.       Relevant past medical, surgical, family and social history reviewed and updated as indicated. Interim medical history since our last visit reviewed. Allergies and medications reviewed and updated. Outpatient Medications Prior to Visit  Medication Sig Dispense Refill   ammonium lactate (AMLACTIN) 12 % cream Apply topically as needed. 385 g 0    amphetamine-dextroamphetamine (ADDERALL) 20 MG tablet Take 1 tablet (20 mg total) by mouth 2 (two) times daily. 60 tablet 0   ARMOUR THYROID 60 MG tablet Take 120 mg by mouth daily.     finasteride (PROPECIA) 1 MG tablet TAKE 1 TABLET ONCE A DAY. FOR HAIR THINNING     fluticasone (FLONASE) 50 MCG/ACT nasal spray SPRAY 1 SPRAY INTO EACH NOSTRIL TWICE A DAY 48 mL 1   gabapentin (NEURONTIN) 100 MG capsule Take 1 capsule (100 mg total) by mouth 2 (two) times daily as needed (sciatic pain). 90 capsule 0   liothyronine (CYTOMEL) 5 MCG tablet 25 mcg.     zolpidem (AMBIEN) 10 MG tablet Take 10 mg by mouth at bedtime as needed.     No facility-administered medications prior to visit.     Per HPI unless specifically indicated in ROS section below Review of Systems  Constitutional:  Positive for chills and fatigue.  HENT:  Positive for congestion. Negative for sore throat.   Respiratory:  Positive for cough and shortness of breath.   Cardiovascular:  Negative for chest pain.   Objective:  There were no vitals taken for this visit.  Wt Readings from Last 3 Encounters:  02/28/23 151 lb (68.5 kg)  10/26/21 167 lb (75.8 kg)  10/27/20 162 lb (73.5 kg)       Physical exam: General: Alert and oriented x 3, no distress, does appear sickly  Pulmonary: Speaks in complete sentences without increased work of breathing, congested cough noted during visit.  Psychiatric: Normal mood, thought content, and behavior.     Results for orders placed or performed  in visit on 02/28/23  DRUG MONITORING, PANEL 8 WITH CONFIRMATION, URINE  Result Value Ref Range   Alcohol Metabolites NEGATIVE <500 ng/mL   Amphetamines POSITIVE (A) <500 ng/mL   Amphetamine 1,245 (H) <250 ng/mL   Methamphetamine NEGATIVE <250 ng/mL   Amphetamines Comments     Benzodiazepines NEGATIVE <100 ng/mL   Buprenorphine, Urine NEGATIVE <5 ng/mL   Cocaine Metabolite NEGATIVE <150 ng/mL   6 Acetylmorphine NEGATIVE <10 ng/mL   Marijuana  Metabolite NEGATIVE <20 ng/mL   MDMA NEGATIVE <500 ng/mL   Opiates NEGATIVE <100 ng/mL   Oxycodone NEGATIVE <100 ng/mL   Creatinine 10.6 (L) > or = 20.0 mg/dL   Specific Gravity 7.169 > or = 1.003   pH 5.3 4.5 - 9.0   Oxidant NEGATIVE <200 mcg/mL  DM TEMPLATE  Result Value Ref Range   Notes and Comments     Assessment & Plan:   Problem List Items Addressed This Visit       Other   Acute cough - Primary    Symptoms suggestive of viral etiology, and exam is limited given virtual platform. Given her husband's prior illness that required antibiotic treatment, coupled with presentation today, will treat.  Start Azithromycin antibiotics for infection. Take 2 tablets by mouth today, then 1 tablet daily for 4 additional days. Start [employer]. Take 1 capsule by mouth three times daily as needed for cough.  Fluids, rest, follow up PRN      Relevant Medications   azithromycin (ZITHROMAX) 250 MG tablet   benzonatate (TESSALON) 200 MG capsule     Meds ordered this encounter  Medications   azithromycin (ZITHROMAX) 250 MG tablet    Sig: Take 2 tablets by mouth today, then 1 tablet daily for 4 additional days.    Dispense:  6 tablet    Refill:  0    Order Specific Question:   Supervising Provider    Answer:   BEDSOLE, AMY E [[id number]]   benzonatate (TESSALON) 200 MG capsule    Sig: Take 1 capsule (200 mg total) by mouth 3 (three) times daily as needed for cough.    Dispense:  15 capsule    Refill:  0    Order Specific Question:   Supervising Provider    Answer:   BEDSOLE, AMY E [[id number]]   No orders of the defined types were placed in this encounter.   I discussed the assessment and treatment plan with the patient. The patient was provided an opportunity to ask questions and all were answered. The patient agreed with the plan and demonstrated an understanding of the instructions. The patient was advised to call back or seek an in-person evaluation if the symptoms worsen or if the  condition fails to improve as anticipated.  Follow up plan:  Start Azithromycin antibiotics for infection. Take 2 tablets by mouth today, then 1 tablet daily for 4 additional days.  You may take Benzonatate capsules for cough. Take 1 capsule by mouth three times daily as needed for cough.  It was a pleasure to see you today!   Doreene Nest, NP

## 2023-09-15 ENCOUNTER — Other Ambulatory Visit: Payer: Self-pay | Admitting: Primary Care

## 2023-09-15 DIAGNOSIS — F909 Attention-deficit hyperactivity disorder, unspecified type: Secondary | ICD-10-CM

## 2023-09-15 MED ORDER — AMPHETAMINE-DEXTROAMPHETAMINE 20 MG PO TABS: 20.0000 mg | ORAL_TABLET | Freq: Two times a day (BID) | ORAL | 0 refills | Status: DC

## 2023-10-03 ENCOUNTER — Telehealth (INDEPENDENT_AMBULATORY_CARE_PROVIDER_SITE_OTHER): Payer: Self-pay | Admitting: NURSE PRACTITIONER

## 2023-10-03 DIAGNOSIS — M542 Cervicalgia: Secondary | ICD-10-CM

## 2023-10-03 DIAGNOSIS — G629 Polyneuropathy, unspecified: Secondary | ICD-10-CM

## 2023-10-20 ENCOUNTER — Other Ambulatory Visit: Payer: Self-pay | Admitting: Primary Care

## 2023-10-20 ENCOUNTER — Encounter (INDEPENDENT_AMBULATORY_CARE_PROVIDER_SITE_OTHER): Payer: Self-pay | Admitting: NURSE PRACTITIONER

## 2023-10-20 DIAGNOSIS — F909 Attention-deficit hyperactivity disorder, unspecified type: Secondary | ICD-10-CM

## 2023-10-20 MED ORDER — AMPHETAMINE-DEXTROAMPHETAMINE 20 MG PO TABS: 20.0000 mg | ORAL_TABLET | Freq: Two times a day (BID) | ORAL | 0 refills | Status: DC

## 2023-11-25 ENCOUNTER — Other Ambulatory Visit: Payer: Self-pay | Admitting: Primary Care

## 2023-11-25 DIAGNOSIS — F909 Attention-deficit hyperactivity disorder, unspecified type: Secondary | ICD-10-CM

## 2023-11-25 MED ORDER — AMPHETAMINE-DEXTROAMPHETAMINE 20 MG PO TABS: 20.0000 mg | ORAL_TABLET | Freq: Two times a day (BID) | ORAL | 0 refills | Status: DC

## 2023-12-28 ENCOUNTER — Other Ambulatory Visit: Payer: Self-pay | Admitting: Primary Care

## 2023-12-28 DIAGNOSIS — F909 Attention-deficit hyperactivity disorder, unspecified type: Secondary | ICD-10-CM

## 2023-12-28 MED ORDER — AMPHETAMINE-DEXTROAMPHETAMINE 20 MG PO TABS: 20.0000 mg | ORAL_TABLET | Freq: Two times a day (BID) | ORAL | 0 refills | Status: DC

## 2024-01-18 ENCOUNTER — Telehealth (INDEPENDENT_AMBULATORY_CARE_PROVIDER_SITE_OTHER): Payer: Self-pay | Admitting: NURSE PRACTITIONER

## 2024-01-18 DIAGNOSIS — F419 Anxiety disorder, unspecified: Secondary | ICD-10-CM | POA: Insufficient documentation

## 2024-01-18 DIAGNOSIS — I10 Essential (primary) hypertension: Secondary | ICD-10-CM | POA: Insufficient documentation

## 2024-01-18 DIAGNOSIS — E039 Hypothyroidism, unspecified: Secondary | ICD-10-CM | POA: Insufficient documentation

## 2024-01-18 DIAGNOSIS — R7301 Impaired fasting glucose: Secondary | ICD-10-CM

## 2024-01-18 DIAGNOSIS — E063 Autoimmune thyroiditis: Secondary | ICD-10-CM | POA: Insufficient documentation

## 2024-01-18 DIAGNOSIS — K449 Diaphragmatic hernia without obstruction or gangrene: Secondary | ICD-10-CM | POA: Insufficient documentation

## 2024-01-18 DIAGNOSIS — F3341 Major depressive disorder, recurrent, in partial remission: Secondary | ICD-10-CM | POA: Insufficient documentation

## 2024-01-18 DIAGNOSIS — K222 Esophageal obstruction: Secondary | ICD-10-CM | POA: Insufficient documentation

## 2024-01-18 DIAGNOSIS — G4733 Obstructive sleep apnea (adult) (pediatric): Secondary | ICD-10-CM | POA: Insufficient documentation

## 2024-01-18 DIAGNOSIS — K219 Gastro-esophageal reflux disease without esophagitis: Secondary | ICD-10-CM | POA: Insufficient documentation

## 2024-01-18 DIAGNOSIS — E785 Hyperlipidemia, unspecified: Secondary | ICD-10-CM

## 2024-01-18 DIAGNOSIS — G43909 Migraine, unspecified, not intractable, without status migrainosus: Secondary | ICD-10-CM | POA: Insufficient documentation

## 2024-01-18 LAB — HGA1C (HEMOGLOBIN A1C WITH EST AVG GLUCOSE)
EAG (MG/DL): 108
HEMOGLOBIN A1C: 5.4

## 2024-01-19 ENCOUNTER — Encounter (INDEPENDENT_AMBULATORY_CARE_PROVIDER_SITE_OTHER): Payer: Self-pay | Admitting: NURSE PRACTITIONER

## 2024-01-19 DIAGNOSIS — R7301 Impaired fasting glucose: Secondary | ICD-10-CM

## 2024-01-22 ENCOUNTER — Ambulatory Visit (INDEPENDENT_AMBULATORY_CARE_PROVIDER_SITE_OTHER): Payer: Self-pay | Admitting: NURSE PRACTITIONER

## 2024-01-22 ENCOUNTER — Telehealth (HOSPITAL_COMMUNITY): Payer: Self-pay | Admitting: NURSE PRACTITIONER

## 2024-01-22 DIAGNOSIS — R200 Anesthesia of skin: Secondary | ICD-10-CM

## 2024-01-22 DIAGNOSIS — M79604 Pain in right leg: Secondary | ICD-10-CM

## 2024-01-22 DIAGNOSIS — R230 Cyanosis: Secondary | ICD-10-CM

## 2024-01-22 NOTE — Telephone Encounter (Signed)
 Patient scheduled at [facility]  5/16

## 2024-01-24 ENCOUNTER — Other Ambulatory Visit (INDEPENDENT_AMBULATORY_CARE_PROVIDER_SITE_OTHER): Payer: Self-pay | Admitting: NURSE PRACTITIONER

## 2024-01-24 ENCOUNTER — Encounter (INDEPENDENT_AMBULATORY_CARE_PROVIDER_SITE_OTHER): Payer: Self-pay | Admitting: NURSE PRACTITIONER

## 2024-01-24 DIAGNOSIS — E039 Hypothyroidism, unspecified: Secondary | ICD-10-CM

## 2024-01-24 DIAGNOSIS — I10 Essential (primary) hypertension: Secondary | ICD-10-CM

## 2024-01-24 DIAGNOSIS — E063 Autoimmune thyroiditis: Secondary | ICD-10-CM

## 2024-01-24 DIAGNOSIS — E785 Hyperlipidemia, unspecified: Secondary | ICD-10-CM

## 2024-01-24 DIAGNOSIS — R7301 Impaired fasting glucose: Secondary | ICD-10-CM

## 2024-01-24 DIAGNOSIS — K219 Gastro-esophageal reflux disease without esophagitis: Secondary | ICD-10-CM

## 2024-01-24 DIAGNOSIS — G4733 Obstructive sleep apnea (adult) (pediatric): Secondary | ICD-10-CM

## 2024-01-26 ENCOUNTER — Telehealth (INDEPENDENT_AMBULATORY_CARE_PROVIDER_SITE_OTHER): Payer: Self-pay | Admitting: NURSE PRACTITIONER

## 2024-01-26 DIAGNOSIS — E039 Hypothyroidism, unspecified: Secondary | ICD-10-CM

## 2024-01-26 DIAGNOSIS — R946 Abnormal results of thyroid function studies: Secondary | ICD-10-CM

## 2024-01-29 ENCOUNTER — Telehealth (INDEPENDENT_AMBULATORY_CARE_PROVIDER_SITE_OTHER): Payer: Self-pay | Admitting: NURSE PRACTITIONER

## 2024-01-29 ENCOUNTER — Encounter (INDEPENDENT_AMBULATORY_CARE_PROVIDER_SITE_OTHER): Payer: Self-pay | Admitting: NURSE PRACTITIONER

## 2024-01-29 NOTE — Telephone Encounter (Signed)
 Patient notified of results and is to consider vascular referral and let us  know.

## 2024-01-30 ENCOUNTER — Encounter (INDEPENDENT_AMBULATORY_CARE_PROVIDER_SITE_OTHER): Payer: Self-pay | Admitting: NURSE PRACTITIONER

## 2024-01-30 ENCOUNTER — Telehealth (INDEPENDENT_AMBULATORY_CARE_PROVIDER_SITE_OTHER): Payer: Self-pay | Admitting: NURSE PRACTITIONER

## 2024-01-30 DIAGNOSIS — R230 Cyanosis: Secondary | ICD-10-CM

## 2024-01-30 DIAGNOSIS — I739 Peripheral vascular disease, unspecified: Secondary | ICD-10-CM

## 2024-01-31 ENCOUNTER — Ambulatory Visit (HOSPITAL_COMMUNITY): Payer: Self-pay

## 2024-01-31 ENCOUNTER — Telehealth (INDEPENDENT_AMBULATORY_CARE_PROVIDER_SITE_OTHER): Payer: Self-pay | Admitting: NURSE PRACTITIONER

## 2024-01-31 NOTE — Telephone Encounter (Signed)
 Called [facility] Scheduling at [phone] and spoke to [place] to schedule thyroid  us  on 02/27/24 at 4pm at [facility]. Called patient at [phone] and left detailed message with appointment date, time and location

## 2024-02-06 ENCOUNTER — Telehealth (INDEPENDENT_AMBULATORY_CARE_PROVIDER_SITE_OTHER): Payer: Self-pay | Admitting: NURSE PRACTITIONER

## 2024-02-06 ENCOUNTER — Encounter (INDEPENDENT_AMBULATORY_CARE_PROVIDER_SITE_OTHER): Payer: Self-pay | Admitting: NURSE PRACTITIONER

## 2024-02-06 NOTE — Telephone Encounter (Signed)
 Patient notified of results.

## 2024-02-07 DIAGNOSIS — F909 Attention-deficit hyperactivity disorder, unspecified type: Secondary | ICD-10-CM

## 2024-02-07 MED ORDER — AMPHETAMINE-DEXTROAMPHETAMINE 20 MG PO TABS: 20.0000 mg | ORAL_TABLET | Freq: Two times a day (BID) | ORAL | 0 refills | Status: DC

## 2024-02-12 ENCOUNTER — Encounter (INDEPENDENT_AMBULATORY_CARE_PROVIDER_SITE_OTHER): Payer: Self-pay | Admitting: NURSE PRACTITIONER

## 2024-02-13 ENCOUNTER — Ambulatory Visit: Admitting: Primary Care

## 2024-02-13 ENCOUNTER — Encounter: Payer: Self-pay | Admitting: Primary Care

## 2024-02-13 VITALS — BP 136/78 | HR 102 | Temp 97.1°F | Ht 69.0 in | Wt 150.0 lb

## 2024-02-13 DIAGNOSIS — M5432 Sciatica, left side: Secondary | ICD-10-CM

## 2024-02-13 DIAGNOSIS — Z0000 Encounter for general adult medical examination without abnormal findings: Secondary | ICD-10-CM | POA: Diagnosis not present

## 2024-02-13 DIAGNOSIS — F909 Attention-deficit hyperactivity disorder, unspecified type: Secondary | ICD-10-CM | POA: Diagnosis not present

## 2024-02-13 DIAGNOSIS — Z79899 Other long term (current) drug therapy: Secondary | ICD-10-CM | POA: Diagnosis not present

## 2024-02-13 DIAGNOSIS — M5431 Sciatica, right side: Secondary | ICD-10-CM

## 2024-02-13 DIAGNOSIS — Z23 Encounter for immunization: Secondary | ICD-10-CM | POA: Diagnosis not present

## 2024-02-13 DIAGNOSIS — E079 Disorder of thyroid, unspecified: Secondary | ICD-10-CM | POA: Diagnosis not present

## 2024-02-13 DIAGNOSIS — Z8619 Personal history of other infectious and parasitic diseases: Secondary | ICD-10-CM

## 2024-02-13 NOTE — Assessment & Plan Note (Signed)
 Controlled.  Continue Addreall 20 mg once to twice daily. Will update controlled substance contract and UDS.

## 2024-02-13 NOTE — Assessment & Plan Note (Addendum)
 Tetanus vaccine provided today.  She will schedule nurse visits for the Shingrix vaccines. Pap smear UTD. Follows with GYN Mammogram UTD, follows with GYN Colonoscopy due, she declines today but will notify when ready  Discussed the importance of a healthy diet and regular exercise in order for weight loss, and to reduce the risk of further co-morbidity.  Exam stable. Labs declined as she has these done through GYN and functional medicine doctor.   Follow up in 1 year for repeat physical.

## 2024-02-13 NOTE — Assessment & Plan Note (Signed)
 Controlled.  Continue gabapentin  100 mg PRN for which she uses sparingly,

## 2024-02-13 NOTE — Patient Instructions (Signed)
 Schedule nurse visits for the shingles vaccines.  The second shingles shot is 2 to 6 months from the first.  Stop by the lab prior to leaving today. I will notify you of your results once received.   It was a pleasure to see you today!

## 2024-02-13 NOTE — Assessment & Plan Note (Signed)
 Following with Dr. Docia Freeman. Continue Cytomel 5 mg daily, Armour Thyroid  25 mg daily.

## 2024-02-13 NOTE — Progress Notes (Addendum)
 Subjective:    Patient ID: Jocelyn Watson, female    DOB: March 24, 1971, 53 y.o.   MRN: [id number]  HPI  Jocelyn Watson is a very pleasant 53 y.o. female who presents today for complete physical and follow up of chronic conditions.  Immunizations: -Tetanus: Completed > 10 years ago.  -Shingles: Never completed Shingrix series  Diet: Fair diet.  Exercise: Regular exercise.  Eye exam: Completes annually  Dental exam: Completes semi-annually    Pap Smear: Completed per GYN, UTD Mammogram: Completes per GYN.   Colonoscopy: Never completed, declines today  BP Readings from Last 3 Encounters:  02/13/24 136/78  02/28/23 112/72  10/26/21 124/78         Review of Systems  Constitutional:  Negative for unexpected weight change.  HENT:  Negative for rhinorrhea.   Respiratory:  Negative for cough and shortness of breath.   Cardiovascular:  Negative for chest pain.  Gastrointestinal:  Negative for constipation and diarrhea.  Genitourinary:  Negative for difficulty urinating.  Musculoskeletal:  Negative for arthralgias and myalgias.  Skin:  Negative for rash.  Allergic/Immunologic: Negative for environmental allergies.  Neurological:  Negative for dizziness, numbness and headaches.  Psychiatric/Behavioral:  The patient is not nervous/anxious.          Past Medical History:  Diagnosis Date   ADHD (attention deficit hyperactivity disorder)    Allergic rhinitis    Dry skin    Herpes simplex     Social History   Socioeconomic History   Marital status: Married    Spouse name: Not on file   Number of children: Not on file   Years of education: Not on file   Highest education level: Some college, no degree  Occupational History   Not on file  Tobacco Use   Smoking status: Never   Smokeless tobacco: Never  Substance and Sexual Activity   Alcohol use: No    Alcohol/week: 0.0 standard drinks of alcohol   Drug use: No   Sexual activity: Not on file  Other  Topics Concern   Not on file  Social History Narrative   Married.   No Children.   Works in [employer].   Enjoys entertaining, working out.    Social Drivers of Corporate investment banker Strain: Low Risk  (02/12/2024)   Overall Financial Resource Strain (CARDIA)    Difficulty of Paying Living Expenses: Not very hard  Food Insecurity: No Food Insecurity (02/12/2024)   Hunger Vital Sign    Worried About Running Out of Food in the Last Year: Never true    Ran Out of Food in the Last Year: Never true  Transportation Needs: No Transportation Needs (02/12/2024)   PRAPARE - Administrator, Civil Service (Medical): No    Lack of Transportation (Non-Medical): No  Physical Activity: Sufficiently Active (02/12/2024)   Exercise Vital Sign    Days of Exercise per Week: 4 days    Minutes of Exercise per Session: 40 min  Stress: No Stress Concern Present (02/12/2024)   [employer] of Occupational Health - Occupational Stress Questionnaire    Feeling of Stress : Not at all  Social Connections: Socially Integrated (02/12/2024)   Social Connection and Isolation Panel [NHANES]    Frequency of Communication with Friends and Family: More than three times a week    Frequency of Social Gatherings with Friends and Family: More than three times a week    Attends Religious Services: More than 4 times per  year    Active Member of Clubs or Organizations: Yes    Attends Banker Meetings: More than 4 times per year    Marital Status: Married  Catering manager Violence: Unknown (12/16/2021)   Received from [facility]   HITS    Physically Hurt: Not on file    Insult or Talk Down To: Not on file    Threaten Physical Harm: Not on file    Scream or Curse: Not on file    Past Surgical History:  Procedure Laterality Date   Cataract surgery Bilateral 03/2022    Family History  Problem Relation Age of Onset   Hypothyroidism Mother    Heart disease Father     Diabetes Maternal Aunt     Allergies  Allergen Reactions   Codeine     REACTION: fever, vomiting    Current Outpatient Medications on File Prior to Visit  Medication Sig Dispense Refill   ammonium lactate  (AMLACTIN) 12 % cream Apply topically as needed. 385 g 0   amphetamine -dextroamphetamine  (ADDERALL) 20 MG tablet Take 1 tablet (20 mg total) by mouth 2 (two) times daily. 60 tablet 0   ARMOUR THYROID  60 MG tablet Take 120 mg by mouth daily.     finasteride (PROPECIA) 1 MG tablet TAKE 1 TABLET ONCE A DAY. FOR HAIR THINNING     fluticasone  (FLONASE ) 50 MCG/ACT nasal spray SPRAY 1 SPRAY INTO EACH NOSTRIL TWICE A DAY 48 mL 1   gabapentin  (NEURONTIN ) 100 MG capsule Take 1 capsule (100 mg total) by mouth 2 (two) times daily as needed (sciatic pain). (Patient not taking: Reported on 02/13/2024) 90 capsule 0   liothyronine (CYTOMEL) 5 MCG tablet 25 mcg. (Patient not taking: Reported on 02/13/2024)     zolpidem (AMBIEN) 10 MG tablet Take 10 mg by mouth at bedtime as needed. (Patient not taking: Reported on 02/13/2024)     No current facility-administered medications on file prior to visit.    BP 136/78   Pulse (!) 102   Temp (!) 97.1 F (36.2 C) (Temporal)   Ht 5\' 9"  (1.753 m)   Wt 150 lb (68 kg)   LMP 02/05/2024   SpO2 97%   BMI 22.15 kg/m  Objective:   Physical Exam HENT:     Right Ear: Tympanic membrane and ear canal normal.     Left Ear: Tympanic membrane and ear canal normal.  Eyes:     Pupils: Pupils are equal, round, and reactive to light.  Cardiovascular:     Rate and Rhythm: Normal rate and regular rhythm.  Pulmonary:     Effort: Pulmonary effort is normal.     Breath sounds: Normal breath sounds.  Abdominal:     General: Bowel sounds are normal.     Palpations: Abdomen is soft.     Tenderness: There is no abdominal tenderness.  Musculoskeletal:        General: Normal range of motion.     Cervical back: Neck supple.  Skin:    General: Skin is warm and dry.   Neurological:     Mental Status: She is alert and oriented to person, place, and time.     Cranial Nerves: No cranial nerve deficit.     Deep Tendon Reflexes:     Reflex Scores:      Patellar reflexes are 2+ on the right side and 2+ on the left side. Psychiatric:        Mood and Affect: Mood normal.  Assessment & Plan:  Preventative health care Assessment & Plan: Tetanus vaccine provided today.  She will schedule nurse visits for the Shingrix vaccines. Pap smear UTD. Follows with GYN Mammogram UTD, follows with GYN Colonoscopy due, she declines today but will notify when ready  Discussed the importance of a healthy diet and regular exercise in order for weight loss, and to reduce the risk of further co-morbidity.  Exam stable. Labs declined as she has these done through GYN and functional medicine doctor.   Follow up in 1 year for repeat physical.    Thyroid  disorder Assessment & Plan: Following with Dr. Docia Freeman. Continue Cytomel 5 mg daily, Armour Thyroid  25 mg daily.   Attention deficit hyperactivity disorder (ADHD), unspecified ADHD type Assessment & Plan: Controlled.  Continue Addreall 20 mg once to twice daily. Will update controlled substance contract and UDS.   Orders: -     DRUG MONITORING, PANEL 8 WITH CONFIRMATION, URINE  High risk medication use -     DRUG MONITORING, PANEL 8 WITH CONFIRMATION, URINE  Bilateral sciatica Assessment & Plan: Controlled.  Continue gabapentin  100 mg PRN for which she uses sparingly,   H/O cold sores Assessment & Plan: Stable.  Continue to monitor.         Lillan Mccreadie K Coree Riester, NP

## 2024-02-13 NOTE — Assessment & Plan Note (Signed)
 Stable.       - Continue to monitor

## 2024-02-14 ENCOUNTER — Encounter (INDEPENDENT_AMBULATORY_CARE_PROVIDER_SITE_OTHER): Payer: Self-pay | Admitting: NURSE PRACTITIONER

## 2024-02-15 ENCOUNTER — Ambulatory Visit: Payer: Self-pay | Admitting: Primary Care

## 2024-02-15 LAB — DRUG MONITORING, PANEL 8 WITH CONFIRMATION, URINE
6 Acetylmorphine: NEGATIVE ng/mL (ref <10)
Alcohol Metabolites: NEGATIVE ng/mL (ref <500)
Amphetamine: 2124 ng/mL — ABNORMAL HIGH (ref <250)
Amphetamines: POSITIVE ng/mL — AB (ref <500)
Benzodiazepines: NEGATIVE ng/mL (ref <100)
Buprenorphine, Urine: NEGATIVE ng/mL (ref <5)
Cocaine Metabolite: NEGATIVE ng/mL (ref <150)
Creatinine: 24.2 mg/dL (ref >=20.0)
MDMA: NEGATIVE ng/mL (ref <500)
Marijuana Metabolite: NEGATIVE ng/mL (ref <20)
Methamphetamine: NEGATIVE ng/mL (ref <250)
Opiates: NEGATIVE ng/mL (ref <100)
Oxidant: NEGATIVE ug/mL (ref <200)
Oxycodone: NEGATIVE ng/mL (ref <100)
pH: 6.1 (ref 4.5–9.0)

## 2024-02-15 LAB — DM TEMPLATE

## 2024-02-27 ENCOUNTER — Ambulatory Visit (INDEPENDENT_AMBULATORY_CARE_PROVIDER_SITE_OTHER)

## 2024-02-27 DIAGNOSIS — Z23 Encounter for immunization: Secondary | ICD-10-CM

## 2024-03-01 ENCOUNTER — Other Ambulatory Visit (INDEPENDENT_AMBULATORY_CARE_PROVIDER_SITE_OTHER): Payer: Self-pay | Admitting: NURSE PRACTITIONER

## 2024-03-01 ENCOUNTER — Ambulatory Visit (INDEPENDENT_AMBULATORY_CARE_PROVIDER_SITE_OTHER): Payer: Self-pay | Admitting: NURSE PRACTITIONER

## 2024-03-01 DIAGNOSIS — R946 Abnormal results of thyroid function studies: Secondary | ICD-10-CM

## 2024-03-01 DIAGNOSIS — E039 Hypothyroidism, unspecified: Secondary | ICD-10-CM

## 2024-03-01 DIAGNOSIS — G4733 Obstructive sleep apnea (adult) (pediatric): Secondary | ICD-10-CM

## 2024-03-06 ENCOUNTER — Encounter (INDEPENDENT_AMBULATORY_CARE_PROVIDER_SITE_OTHER): Payer: Self-pay | Admitting: NURSE PRACTITIONER

## 2024-03-08 ENCOUNTER — Encounter (INDEPENDENT_AMBULATORY_CARE_PROVIDER_SITE_OTHER): Payer: Self-pay | Admitting: NURSE PRACTITIONER

## 2024-03-10 DIAGNOSIS — F909 Attention-deficit hyperactivity disorder, unspecified type: Secondary | ICD-10-CM

## 2024-03-11 MED ORDER — AMPHETAMINE-DEXTROAMPHETAMINE 20 MG PO TABS: 20.0000 mg | ORAL_TABLET | Freq: Two times a day (BID) | ORAL | 0 refills | Status: DC

## 2024-03-11 NOTE — Telephone Encounter (Signed)
 LOV - 02/13/24 NOV - NA RF - 02/07/24 #60/0

## 2024-03-14 MED ORDER — ZEPBOUND 2.5 MG/0.5 ML SUBCUTANEOUS PEN INJECTOR
2.5000 mg | PEN_INJECTOR | SUBCUTANEOUS | 3 refills | Status: AC
Start: 2024-03-14 — End: ?

## 2024-03-14 NOTE — Telephone Encounter (Deleted)
 New rx sent

## 2024-04-03 ENCOUNTER — Telehealth (INDEPENDENT_AMBULATORY_CARE_PROVIDER_SITE_OTHER): Payer: Self-pay | Admitting: NURSE PRACTITIONER

## 2024-04-03 DIAGNOSIS — K219 Gastro-esophageal reflux disease without esophagitis: Secondary | ICD-10-CM

## 2024-04-03 MED ORDER — PANTOPRAZOLE 40 MG TABLET,DELAYED RELEASE
40.0000 mg | DELAYED_RELEASE_TABLET | Freq: Every day | ORAL | 3 refills | Status: AC
Start: 2024-04-03 — End: ?

## 2024-04-04 ENCOUNTER — Ambulatory Visit (INDEPENDENT_AMBULATORY_CARE_PROVIDER_SITE_OTHER): Admitting: NURSE PRACTITIONER

## 2024-04-04 ENCOUNTER — Encounter (INDEPENDENT_AMBULATORY_CARE_PROVIDER_SITE_OTHER): Payer: Self-pay | Admitting: NURSE PRACTITIONER

## 2024-04-04 ENCOUNTER — Other Ambulatory Visit: Payer: Self-pay

## 2024-04-04 VITALS — BP 114/80 | HR 71 | Temp 97.8°F | Ht 63.0 in | Wt 207.0 lb

## 2024-04-04 DIAGNOSIS — Z1502 Genetic susceptibility to malignant neoplasm of ovary: Secondary | ICD-10-CM

## 2024-04-04 DIAGNOSIS — Z0000 Encounter for general adult medical examination without abnormal findings: Secondary | ICD-10-CM

## 2024-04-04 DIAGNOSIS — K449 Diaphragmatic hernia without obstruction or gangrene: Secondary | ICD-10-CM

## 2024-04-04 DIAGNOSIS — K222 Esophageal obstruction: Secondary | ICD-10-CM

## 2024-04-04 DIAGNOSIS — E063 Autoimmune thyroiditis: Secondary | ICD-10-CM

## 2024-04-04 DIAGNOSIS — F3341 Major depressive disorder, recurrent, in partial remission: Secondary | ICD-10-CM

## 2024-04-04 DIAGNOSIS — Z1509 Genetic susceptibility to other malignant neoplasm: Secondary | ICD-10-CM

## 2024-04-04 DIAGNOSIS — G43909 Migraine, unspecified, not intractable, without status migrainosus: Secondary | ICD-10-CM

## 2024-04-04 DIAGNOSIS — K219 Gastro-esophageal reflux disease without esophagitis: Secondary | ICD-10-CM

## 2024-04-04 DIAGNOSIS — F419 Anxiety disorder, unspecified: Secondary | ICD-10-CM

## 2024-04-04 DIAGNOSIS — Z6836 Body mass index (BMI) 36.0-36.9, adult: Secondary | ICD-10-CM

## 2024-04-04 DIAGNOSIS — E039 Hypothyroidism, unspecified: Secondary | ICD-10-CM

## 2024-04-04 DIAGNOSIS — G4733 Obstructive sleep apnea (adult) (pediatric): Secondary | ICD-10-CM

## 2024-04-04 DIAGNOSIS — I10 Essential (primary) hypertension: Secondary | ICD-10-CM

## 2024-04-04 DIAGNOSIS — Z1501 Genetic susceptibility to malignant neoplasm of breast: Secondary | ICD-10-CM

## 2024-04-04 DIAGNOSIS — E049 Nontoxic goiter, unspecified: Secondary | ICD-10-CM

## 2024-04-04 NOTE — Assessment & Plan Note (Addendum)
 Stable on Effexor   Will refill today  Orders:    venlafaxine  (EFFEXOR  XR) 150 mg Oral Capsule, Sust. Release 24 hr; Take 1 Capsule (150 mg total) by mouth Daily

## 2024-04-04 NOTE — Assessment & Plan Note (Addendum)
 Stable on CPAP

## 2024-04-04 NOTE — Assessment & Plan Note (Addendum)
 stable

## 2024-04-04 NOTE — Assessment & Plan Note (Addendum)
 Stable on lisinopril   Refills sent today  Orders:    lisinopriL  (PRINIVIL ) 20 mg Oral Tablet; Take 1 Tablet (20 mg total) by mouth Daily

## 2024-04-04 NOTE — Assessment & Plan Note (Addendum)
 Stable

## 2024-04-04 NOTE — Assessment & Plan Note (Addendum)
 Stable recently switched to protonix 

## 2024-04-04 NOTE — Assessment & Plan Note (Addendum)
 Stable on Effexor   Refills sent today  Orders:    venlafaxine  (EFFEXOR  XR) 150 mg Oral Capsule, Sust. Release 24 hr; Take 1 Capsule (150 mg total) by mouth Daily

## 2024-04-04 NOTE — Assessment & Plan Note (Addendum)
 Stable on synthroid  New refills needed  Orders:    levothyroxine  (SYNTHROID) 25 mcg Oral Tablet; Take 1 Tablet (25 mcg total) by mouth Every morning

## 2024-04-04 NOTE — Progress Notes (Signed)
 INTERNAL MEDICINE, [facility] INTERNAL MEDICINE  [address] [place] [address]       Name: Ana Perez MRN:  [id number]   Date of Birth: March 02, 1971 Age: 53 y.o.   Date: 04/04/2024       Reason for visit: Annual Wellness Exam and Medication Discussion (Weight loss medication for OSA )      HPI:  Ana Perez is a 53 y.o. female presents for annual wellness visit. Reports her chronic conditions stable. States she had her labs completed at [facility]. She is an Human resources officer at [employer]. Denies any other new concerns. Is wanting to restart her zepbound  due to her OSA.      Past Medical History:  Past Medical History:   Diagnosis Date    Anxiety     Depression     Esophageal reflux     Esophageal stenosis     Hashimoto's disease     Hiatal hernia     Hypertension     Hypothyroidism     Irritable bowel syndrome     Migraine     Multinodular goiter     OSA (obstructive sleep apnea)      Past Surgical History:   Procedure Laterality Date    COLONOSCOPY  04/29/2022    HX BREAST AUGMENTATION  09/12/2006    HX CESAREAN SECTION      HX CHOLECYSTECTOMY  11/2023    HX PARTIAL HYSTERECTOMY  09/12/2006    HX UPPER ENDOSCOPY  04/29/2022    MASTECTOMY, RADICAL Bilateral 04/12/2022      Allergies[1]  Current Outpatient Medications   Medication Sig    estradioL 4 mcg Vaginal Insert 4 mcg by Vaginal route    levothyroxine  (SYNTHROID) 25 mcg Oral Tablet Take 1 Tablet (25 mcg total) by mouth Every morning    lisinopriL  (PRINIVIL ) 20 mg Oral Tablet     ondansetron (ZOFRAN ODT) 8 mg Oral Tablet, Rapid Dissolve Place 1 Tablet (8 mg total) under the tongue Every 8 hours as needed for Nausea/Vomiting    pantoprazole  (PROTONIX ) 40 mg Oral Tablet, Delayed Release (E.C.) Take 1 Tablet (40 mg total) by mouth Daily    rizatriptan (MAXALT-MLT) 10 mg Oral Tablet, Rapid Dissolve Take 1 Tablet (10 mg total) by mouth Once, as needed for Migraine May repeat in 2 hours if needed.    tirzepatide , weight loss, (ZEPBOUND ) 2.5 mg/0.5 mL Subcutaneous Pen  Injector Inject 0.5 mL (2.5 mg total) under the skin Every 7 days (Patient not taking: Reported on 04/04/2024)    venlafaxine  (EFFEXOR  XR) 150 mg Oral Capsule, Sust. Release 24 hr Take 1 Capsule (150 mg total) by mouth Daily     Family Medical History:       Problem Relation (Age of Onset)    Breast Cancer Mother    Heart Attack Father    Heart Disease Father    Hypertension (High Blood Pressure) Father            Social History     Socioeconomic History    Marital status: Married   Tobacco Use    Smoking status: Never    Smokeless tobacco: Never   Substance and Sexual Activity    Alcohol use: No    Drug use: No       Review of Systems:     The pertinent ROS as addressed in the HPI.    Objective:  Physical Exam:   Vital Signs:  Vitals:    04/04/24 1421  BP: 114/80   Pulse: 71   Temp: 36.6 C (97.8 F)   TempSrc: Temporal   SpO2: 98%   Weight: 93.9 kg (207 lb)   Height: 1.6 m (5' 3)   BMI: 36.67         Body mass index is 36.67 kg/m.   SABRA  Physical Exam:      GENERAL: no acute distress  SKIN: Warm and dry.  HEAD: Atraumatic, normocephalic.  EYES: Pupils equal and reactive. EOMI.  EARS AND NOSE: Normal to examination.  THROAT: No exudate, no pharyngeal erythema noted.  NECK: Supple, Trachea midline, no lymphadenopathy.  CARDIAC: S1 and S2, no murmurs or gallops. No S3.  LUNGS: Clear to auscultation bilaterally. No wheezes or rhonchi. No accessory muscle use noted.  GI: Abdomen soft, nontender. Good bowel sounds x 4. No organomegaly appreciated.  MUSCULOSKELETAL: Good muscle strength throughout.  EXTREMITIES: No pedal edema noted. Distal pulses equal bilaterally.  NEUROLOGICAL: Cranial nerves grossly intact. No gross focal deficit.  PSYCHIATRIC: Appropriate mood and affect.       Assessment & Plan  Routine general medical examination at a health care facility         Depression, major, recurrent, in partial remission (CMS HCC)  Stable on Effexor   Refills sent today  Orders:    venlafaxine  (EFFEXOR  XR) 150 mg Oral  Capsule, Sust. 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 Release 24 hr; Take 1 Capsule (150 mg total) by mouth Daily                    Return in about 1 year (around 04/04/2025).    Sandrina Heaton F Aksel Bencomo, FNP     Portions of this note may be dictated using voice recognition software or a dictation service. Variances in spelling and vocabulary are possible and unintentional. Not all errors are caught/corrected. Please notify the dino if any discrepancies are noted or if the meaning of any statement is not clear.          [1] No Known Allergies

## 2024-04-05 ENCOUNTER — Encounter (INDEPENDENT_AMBULATORY_CARE_PROVIDER_SITE_OTHER): Payer: Self-pay | Admitting: NURSE PRACTITIONER

## 2024-04-05 MED ORDER — LEVOTHYROXINE 25 MCG TABLET: 25.0000 ug | ORAL_TABLET | Freq: Every morning | ORAL | 3 refills | Status: DC

## 2024-04-05 MED ORDER — LISINOPRIL 20 MG TABLET
20.0000 mg | ORAL_TABLET | Freq: Every day | ORAL | 3 refills | Status: AC
Start: 2024-04-05 — End: ?

## 2024-04-05 MED ORDER — VENLAFAXINE ER 150 MG CAPSULE,EXTENDED RELEASE 24 HR
150.0000 mg | ORAL_CAPSULE | Freq: Every day | ORAL | 3 refills | Status: AC
Start: 2024-04-05 — End: ?

## 2024-04-11 ENCOUNTER — Encounter (INDEPENDENT_AMBULATORY_CARE_PROVIDER_SITE_OTHER): Payer: Self-pay | Admitting: NURSE PRACTITIONER

## 2024-04-11 ENCOUNTER — Other Ambulatory Visit (INDEPENDENT_AMBULATORY_CARE_PROVIDER_SITE_OTHER): Payer: Self-pay

## 2024-04-12 DIAGNOSIS — F909 Attention-deficit hyperactivity disorder, unspecified type: Secondary | ICD-10-CM

## 2024-04-12 MED ORDER — AMPHETAMINE-DEXTROAMPHETAMINE 20 MG PO TABS: 20.0000 mg | ORAL_TABLET | Freq: Two times a day (BID) | ORAL | 0 refills | Status: DC

## 2024-04-17 ENCOUNTER — Other Ambulatory Visit: Payer: Self-pay

## 2024-04-17 ENCOUNTER — Telehealth (INDEPENDENT_AMBULATORY_CARE_PROVIDER_SITE_OTHER): Payer: Self-pay | Admitting: NURSE PRACTITIONER

## 2024-04-17 DIAGNOSIS — G4700 Insomnia, unspecified: Secondary | ICD-10-CM

## 2024-04-17 DIAGNOSIS — F909 Attention-deficit hyperactivity disorder, unspecified type: Secondary | ICD-10-CM

## 2024-04-17 MED ORDER — ZOLPIDEM TARTRATE 10 MG PO TABS: 10.0000 mg | ORAL_TABLET | Freq: Every evening | ORAL | 0 refills | Status: DC | PRN

## 2024-04-17 NOTE — Telephone Encounter (Signed)
 PA- zepbound     OSA

## 2024-04-18 ENCOUNTER — Encounter (INDEPENDENT_AMBULATORY_CARE_PROVIDER_SITE_OTHER): Payer: Self-pay

## 2024-04-18 ENCOUNTER — Encounter (INDEPENDENT_AMBULATORY_CARE_PROVIDER_SITE_OTHER): Payer: Self-pay | Admitting: NURSE PRACTITIONER

## 2024-04-18 NOTE — Telephone Encounter (Signed)
 Denied:We received a request for zepbound . This drug or product cannot be approved because it is on a list of drugs and products that are not covered. Your pharmacy plan chooses certain drugs and products to be listed as excluded from coverage.

## 2024-05-08 ENCOUNTER — Encounter (INDEPENDENT_AMBULATORY_CARE_PROVIDER_SITE_OTHER): Payer: Self-pay | Admitting: NURSE PRACTITIONER

## 2024-05-15 MED ORDER — AMPHETAMINE-DEXTROAMPHETAMINE 20 MG PO TABS: 20.0000 mg | ORAL_TABLET | Freq: Two times a day (BID) | ORAL | 0 refills | Status: DC

## 2024-05-19 ENCOUNTER — Telehealth (INDEPENDENT_AMBULATORY_CARE_PROVIDER_SITE_OTHER): Payer: Self-pay | Admitting: NURSE PRACTITIONER

## 2024-05-19 MED ORDER — AMOXICILLIN 500 MG TABLET: 500.0000 mg | ORAL_TABLET | Freq: Two times a day (BID) | ORAL | 0 refills | Status: DC

## 2024-05-27 ENCOUNTER — Telehealth (INDEPENDENT_AMBULATORY_CARE_PROVIDER_SITE_OTHER): Payer: Self-pay | Admitting: NURSE PRACTITIONER

## 2024-05-27 DIAGNOSIS — E039 Hypothyroidism, unspecified: Secondary | ICD-10-CM

## 2024-05-28 ENCOUNTER — Ambulatory Visit

## 2024-05-28 DIAGNOSIS — Z23 Encounter for immunization: Secondary | ICD-10-CM

## 2024-05-28 NOTE — Progress Notes (Signed)
 Per orders of Mallie Gaskins, DPN AGNP-C, injection of Shingrix  given by Laray Arenas in left deltoid. Patient tolerated injection well.

## 2024-06-01 ENCOUNTER — Telehealth (INDEPENDENT_AMBULATORY_CARE_PROVIDER_SITE_OTHER): Payer: Self-pay | Admitting: NURSE PRACTITIONER

## 2024-06-01 MED ORDER — NEOMYCIN-POLYMYXIN-HYDROCORT 3.5 MG-10,000 UNIT/ML-1 % EAR DROPS,SUSP
3.0000 [drp] | Freq: Four times a day (QID) | OTIC | 0 refills | Status: AC
Start: 2024-06-01 — End: 2024-06-08

## 2024-06-01 MED ORDER — PREDNISONE 20 MG TABLET: 20.0000 mg | ORAL_TABLET | Freq: Two times a day (BID) | ORAL | 0 refills | Status: DC

## 2024-06-04 ENCOUNTER — Ambulatory Visit

## 2024-06-18 DIAGNOSIS — F909 Attention-deficit hyperactivity disorder, unspecified type: Secondary | ICD-10-CM

## 2024-06-18 MED ORDER — AMPHETAMINE-DEXTROAMPHETAMINE 20 MG PO TABS: 20.0000 mg | ORAL_TABLET | Freq: Two times a day (BID) | ORAL | 0 refills | Status: DC

## 2024-06-19 ENCOUNTER — Ambulatory Visit (INDEPENDENT_AMBULATORY_CARE_PROVIDER_SITE_OTHER): Payer: Self-pay | Admitting: NURSE PRACTITIONER

## 2024-07-22 DIAGNOSIS — F909 Attention-deficit hyperactivity disorder, unspecified type: Secondary | ICD-10-CM

## 2024-07-22 MED ORDER — AMPHETAMINE-DEXTROAMPHETAMINE 20 MG PO TABS: 20.0000 mg | ORAL_TABLET | Freq: Two times a day (BID) | ORAL | 0 refills | Status: DC

## 2024-07-25 ENCOUNTER — Telehealth (INDEPENDENT_AMBULATORY_CARE_PROVIDER_SITE_OTHER): Payer: Self-pay | Admitting: NURSE PRACTITIONER

## 2024-07-25 DIAGNOSIS — J329 Chronic sinusitis, unspecified: Secondary | ICD-10-CM

## 2024-07-25 DIAGNOSIS — R6884 Jaw pain: Secondary | ICD-10-CM

## 2024-07-29 ENCOUNTER — Telehealth (INDEPENDENT_AMBULATORY_CARE_PROVIDER_SITE_OTHER): Payer: Self-pay | Admitting: NURSE PRACTITIONER

## 2024-07-29 ENCOUNTER — Ambulatory Visit (INDEPENDENT_AMBULATORY_CARE_PROVIDER_SITE_OTHER): Payer: Self-pay | Admitting: NURSE PRACTITIONER

## 2024-07-29 ENCOUNTER — Other Ambulatory Visit (INDEPENDENT_AMBULATORY_CARE_PROVIDER_SITE_OTHER): Payer: Self-pay

## 2024-07-29 DIAGNOSIS — J329 Chronic sinusitis, unspecified: Secondary | ICD-10-CM

## 2024-07-29 DIAGNOSIS — M2550 Pain in unspecified joint: Secondary | ICD-10-CM

## 2024-07-29 DIAGNOSIS — E039 Hypothyroidism, unspecified: Secondary | ICD-10-CM

## 2024-07-29 DIAGNOSIS — R6884 Jaw pain: Secondary | ICD-10-CM

## 2024-08-02 ENCOUNTER — Encounter (INDEPENDENT_AMBULATORY_CARE_PROVIDER_SITE_OTHER): Payer: Self-pay | Admitting: NURSE PRACTITIONER

## 2024-08-02 DIAGNOSIS — M2550 Pain in unspecified joint: Secondary | ICD-10-CM

## 2024-08-02 DIAGNOSIS — E039 Hypothyroidism, unspecified: Secondary | ICD-10-CM

## 2024-08-05 ENCOUNTER — Ambulatory Visit (INDEPENDENT_AMBULATORY_CARE_PROVIDER_SITE_OTHER): Payer: Self-pay | Admitting: NURSE PRACTITIONER

## 2024-08-05 ENCOUNTER — Telehealth (INDEPENDENT_AMBULATORY_CARE_PROVIDER_SITE_OTHER): Payer: Self-pay | Admitting: NURSE PRACTITIONER

## 2024-08-05 DIAGNOSIS — M2550 Pain in unspecified joint: Secondary | ICD-10-CM

## 2024-08-05 DIAGNOSIS — R7689 Other specified abnormal immunological findings in serum: Secondary | ICD-10-CM

## 2024-08-05 MED ORDER — INDOMETHACIN 50 MG CAPSULE: 50.0000 mg | ORAL_CAPSULE | Freq: Two times a day (BID) | ORAL | 1 refills | Status: DC | PRN

## 2024-08-06 ENCOUNTER — Encounter (INDEPENDENT_AMBULATORY_CARE_PROVIDER_SITE_OTHER): Payer: Self-pay | Admitting: NURSE PRACTITIONER

## 2024-08-06 DIAGNOSIS — M2550 Pain in unspecified joint: Secondary | ICD-10-CM

## 2024-08-12 ENCOUNTER — Telehealth (HOSPITAL_COMMUNITY): Payer: Self-pay | Admitting: NURSE PRACTITIONER

## 2024-08-12 NOTE — Telephone Encounter (Signed)
 I called [facility] [phone] to check the referral status spoke with Destiny who stated the referral was not received in office and stated for me to refax the referral to fax number [phone]

## 2024-08-13 ENCOUNTER — Telehealth (HOSPITAL_COMMUNITY): Payer: Self-pay | Admitting: NURSE PRACTITIONER

## 2024-08-13 NOTE — Telephone Encounter (Signed)
 I called [facility] [phone] to check the referral status, spoke with [facility] in office who stated the patient is scheduled for 08/15/24 at 8:40am, Patient has been notfified, CRO will move to close this referral

## 2024-08-21 ENCOUNTER — Ambulatory Visit (INDEPENDENT_AMBULATORY_CARE_PROVIDER_SITE_OTHER)

## 2024-08-21 DIAGNOSIS — L578 Other skin changes due to chronic exposure to nonionizing radiation: Secondary | ICD-10-CM | POA: Diagnosis not present

## 2024-08-21 DIAGNOSIS — Z1283 Encounter for screening for malignant neoplasm of skin: Secondary | ICD-10-CM

## 2024-08-21 DIAGNOSIS — D1801 Hemangioma of skin and subcutaneous tissue: Secondary | ICD-10-CM | POA: Diagnosis not present

## 2024-08-21 DIAGNOSIS — L821 Other seborrheic keratosis: Secondary | ICD-10-CM

## 2024-08-21 DIAGNOSIS — L814 Other melanin hyperpigmentation: Secondary | ICD-10-CM | POA: Diagnosis not present

## 2024-08-21 DIAGNOSIS — W908XXA Exposure to other nonionizing radiation, initial encounter: Secondary | ICD-10-CM | POA: Diagnosis not present

## 2024-08-21 DIAGNOSIS — D229 Melanocytic nevi, unspecified: Secondary | ICD-10-CM

## 2024-08-21 NOTE — Progress Notes (Signed)
°  °  Subjective   Jocelyn Watson is a 53 y.o. female who presents for the following: Total body skin exam for skin cancer screening and mole check. The patient has spots, moles and lesions to be evaluated, some may be new or changing and the patient may have concern these could be cancer.. Patient is new patient  Today patient reports: Patient states she has a spot on her thigh.   Review of Systems:    No other skin or systemic complaints except as noted in HPI or Assessment and Plan.  The following portions of the chart were reviewed this encounter and updated as appropriate: medications, allergies, medical history  Relevant Medical History:  n/a   Objective  (SKPE) Well appearing patient in no apparent distress; mood and affect are within normal limits. Examination was performed of the: Full Skin Examination: scalp, head, eyes, ears, nose, lips, neck, chest, axillae, abdomen, back, buttocks, bilateral upper extremities, bilateral lower extremities, hands, feet, fingers, toes, fingernails, and toenails.   Examination notable for: SKIN EXAM, Angioma(s): Scattered red vascular papule(s)  , Lentigo/lentigines: Scattered pigmented macules that are tan to brown in color and are somewhat non-uniform in shape and concentrated in the sun-exposed areas, Nevus/nevi: Scattered well-demarcated, regular, pigmented macule(s) and/or papule(s)  , Seborrheic Keratosis(es): Stuck-on appearing keratotic papule(s) on the trunk, none  irritated with redness, crusting, edema, and/or partial avulsion, Actinic Damage/Elastosis: chronic sun damage: dyspigmentation, telangiectasia, and wrinkling.  -8 x 7 mm brown macule at R medial upper thigh. Examination limited by: Undergarments         Assessment & Plan  (SKAP)   SKIN CANCER SCREENING PERFORMED TODAY.  BENIGN SKIN FINDINGS  - Lentigines  - Seborrheic keratoses  - Hemangiomas   - Nevus/Multiple Benign Nevi - Reassurance provided regarding the  benign appearance of lesions noted on exam today; no treatment is indicated in the absence of symptoms/changes. - Reinforced importance of photoprotective strategies including liberal and frequent sunscreen use of a broad-spectrum SPF 30 or greater, use of protective clothing, and sun avoidance for prevention of cutaneous malignancy and photoaging.  Counseled patient on the importance of regular self-skin monitoring as well as routine clinical skin examinations as scheduled.   ACTINIC DAMAGE - Chronic condition, secondary to cumulative UV/sun exposure - Recommend daily broad spectrum sunscreen SPF 30+ to sun-exposed areas, reapply every 2 hours as needed.  - Staying in the shade or wearing long sleeves, sun glasses (UVA+UVB protection) and wide brim hats (4-inch brim around the entire circumference of the hat) are also recommended for sun protection.  - Call for new or changing lesions.  Lentigo R medial upper thigh -Photo taken today, discussed biopsy, patient defers opted for observation. Will recheck at follow-up.      Level of service outlined above   Patient instructions (SKPI)   Procedures, orders, diagnosis for this visit:  LENTIGO   SEBORRHEIC KERATOSIS   CHERRY ANGIOMA   MULTIPLE BENIGN NEVI   ACTINIC SKIN DAMAGE    Lentigo  Seborrheic keratosis  Cherry angioma  Multiple benign nevi  Actinic skin damage    Return to clinic: Return in about 1 year (around 08/21/2025) for TBSE.  I, Almetta Nora, RMA, am acting as scribe for Lauraine JAYSON Kanaris, MD .   Documentation: I have reviewed the above documentation for accuracy and completeness, and I agree with the above.  Lauraine JAYSON Kanaris, MD

## 2024-08-21 NOTE — Patient Instructions (Signed)

## 2024-08-22 MED ORDER — AMPHETAMINE-DEXTROAMPHETAMINE 20 MG PO TABS: 20.0000 mg | ORAL_TABLET | Freq: Two times a day (BID) | ORAL | 0 refills | Status: DC

## 2024-08-29 ENCOUNTER — Other Ambulatory Visit: Payer: Self-pay | Admitting: Primary Care

## 2024-08-29 DIAGNOSIS — G4700 Insomnia, unspecified: Secondary | ICD-10-CM

## 2024-09-24 DIAGNOSIS — F909 Attention-deficit hyperactivity disorder, unspecified type: Secondary | ICD-10-CM

## 2024-09-24 MED ORDER — AMPHETAMINE-DEXTROAMPHETAMINE 20 MG PO TABS: 20.0000 mg | ORAL_TABLET | Freq: Two times a day (BID) | ORAL | 0 refills | Status: AC

## 2024-10-15 ENCOUNTER — Other Ambulatory Visit: Payer: Self-pay

## 2024-10-15 ENCOUNTER — Encounter (HOSPITAL_BASED_OUTPATIENT_CLINIC_OR_DEPARTMENT_OTHER): Payer: Self-pay | Admitting: Internal Medicine

## 2024-10-15 ENCOUNTER — Ambulatory Visit: Payer: Self-pay | Admitting: Internal Medicine

## 2024-10-15 VITALS — BP 120/72 | HR 104 | Temp 97.9°F | Ht 63.0 in | Wt 196.0 lb

## 2024-10-15 DIAGNOSIS — E063 Autoimmune thyroiditis: Secondary | ICD-10-CM

## 2024-10-15 NOTE — Patient Instructions (Signed)
 patient instructed to take thyroid medication on empty stomach wait 1hr before eating  and avoid taking vitamins or iron for at least 3 hrs before and after thyroid meds

## 2024-10-16 ENCOUNTER — Telehealth (HOSPITAL_BASED_OUTPATIENT_CLINIC_OR_DEPARTMENT_OTHER): Payer: Self-pay | Admitting: Internal Medicine

## 2024-10-16 ENCOUNTER — Other Ambulatory Visit (HOSPITAL_BASED_OUTPATIENT_CLINIC_OR_DEPARTMENT_OTHER): Payer: Self-pay

## 2024-10-16 DIAGNOSIS — E063 Autoimmune thyroiditis: Secondary | ICD-10-CM

## 2024-10-16 MED ORDER — LEVOTHYROXINE 50 MCG TABLET: 50.0000 ug | ORAL_TABLET | Freq: Every morning | ORAL | 1 refills | Status: AC

## 2024-10-16 NOTE — Telephone Encounter (Signed)
-----   Message from Franky RAMAN sent at 10/16/2024  1:10 PM EST -----  Regarding: lab results  Contact: [phone]  Lab results and dose changes

## 2025-02-11 ENCOUNTER — Ambulatory Visit (HOSPITAL_BASED_OUTPATIENT_CLINIC_OR_DEPARTMENT_OTHER): Payer: Self-pay | Admitting: Internal Medicine

## 2025-04-09 ENCOUNTER — Ambulatory Visit (INDEPENDENT_AMBULATORY_CARE_PROVIDER_SITE_OTHER): Payer: Self-pay | Admitting: NURSE PRACTITIONER

## 2025-08-27 ENCOUNTER — Ambulatory Visit
